# Patient Record
Sex: Female | Born: 1975 | Race: White | Hispanic: No | Marital: Married | Smoking: Never smoker
Health system: Southern US, Community
[De-identification: ages and names within clinical notes are randomized; demographics above are authoritative.]

## PROBLEM LIST (undated history)

## (undated) DIAGNOSIS — D6851 Activated protein C resistance: Secondary | ICD-10-CM

## (undated) DIAGNOSIS — I82409 Acute embolism and thrombosis of unspecified deep veins of unspecified lower extremity: Secondary | ICD-10-CM

## (undated) HISTORY — DX: Activated protein C resistance: D68.51

## (undated) HISTORY — DX: Acute embolism and thrombosis of unspecified deep veins of unspecified lower extremity: I82.409

---

## 2008-06-18 ENCOUNTER — Inpatient Hospital Stay (HOSPITAL_COMMUNITY): Admission: AD | Admit: 2008-06-18 | Discharge: 2008-06-18 | Payer: Self-pay | Admitting: Obstetrics & Gynecology

## 2008-11-15 DIAGNOSIS — I82409 Acute embolism and thrombosis of unspecified deep veins of unspecified lower extremity: Secondary | ICD-10-CM

## 2008-11-15 HISTORY — DX: Acute embolism and thrombosis of unspecified deep veins of unspecified lower extremity: I82.409

## 2008-11-28 ENCOUNTER — Encounter: Admission: RE | Admit: 2008-11-28 | Discharge: 2008-12-14 | Payer: Self-pay | Admitting: Certified Nurse Midwife

## 2008-12-15 ENCOUNTER — Inpatient Hospital Stay (HOSPITAL_COMMUNITY): Admission: AD | Admit: 2008-12-15 | Discharge: 2009-01-01 | Payer: Self-pay | Admitting: Obstetrics

## 2008-12-29 ENCOUNTER — Encounter (INDEPENDENT_AMBULATORY_CARE_PROVIDER_SITE_OTHER): Payer: Self-pay | Admitting: Obstetrics and Gynecology

## 2009-02-13 ENCOUNTER — Encounter (INDEPENDENT_AMBULATORY_CARE_PROVIDER_SITE_OTHER): Payer: Self-pay | Admitting: Obstetrics and Gynecology

## 2009-02-13 ENCOUNTER — Ambulatory Visit: Admission: RE | Admit: 2009-02-13 | Discharge: 2009-02-13 | Payer: Self-pay | Admitting: Obstetrics and Gynecology

## 2009-02-13 ENCOUNTER — Ambulatory Visit: Payer: Self-pay | Admitting: Vascular Surgery

## 2009-04-18 HISTORY — PX: DOPPER R LOWER EXTR (ARMC HX): HXRAD1696

## 2009-07-02 ENCOUNTER — Ambulatory Visit: Payer: Self-pay | Admitting: Oncology

## 2010-01-28 IMAGING — US US OB LIMITED
1 series · 14 of 18 positions shown · non-contrast
Comparison: none

OBSTETRICAL ULTRASOUND:
 This ultrasound exam was performed in the [HOSPITAL] Ultrasound Department.  The OB US report was generated in the AS system, and faxed to the ordering physician.  This report is also available in [REDACTED] PACS.

[Series 1: us fetal bpp w/o nonstress · non-contrast · 14 of 18 slices shown]
[im 1/18]
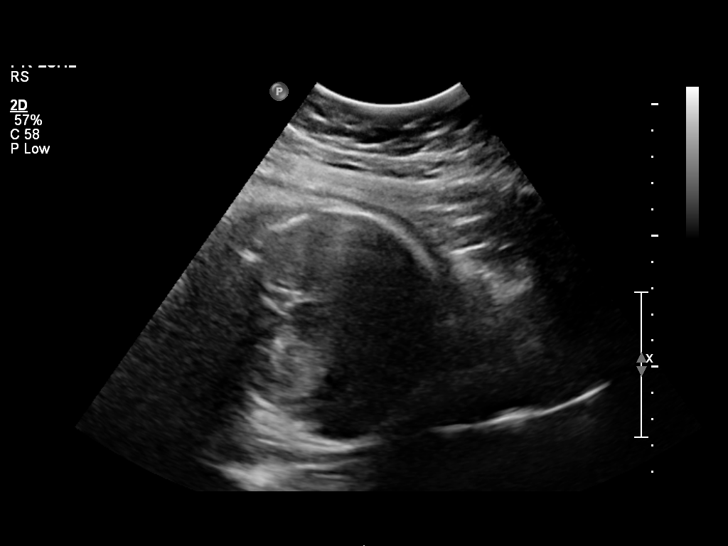
[im 2/18]
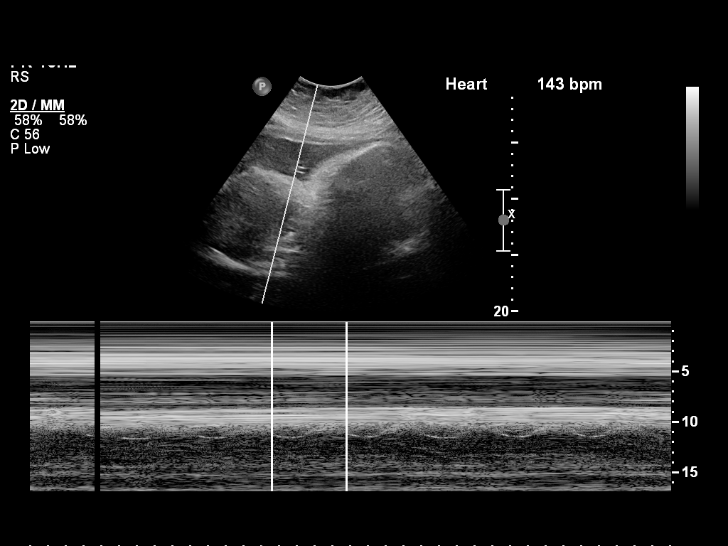
[im 4/18]
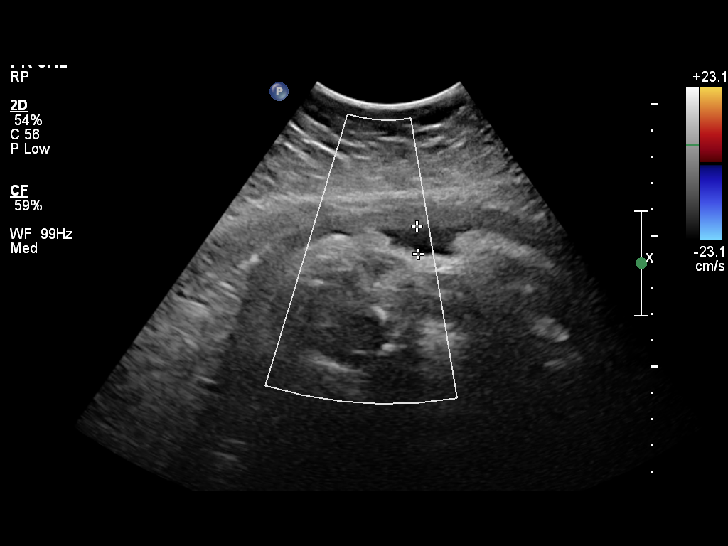
[im 5/18]
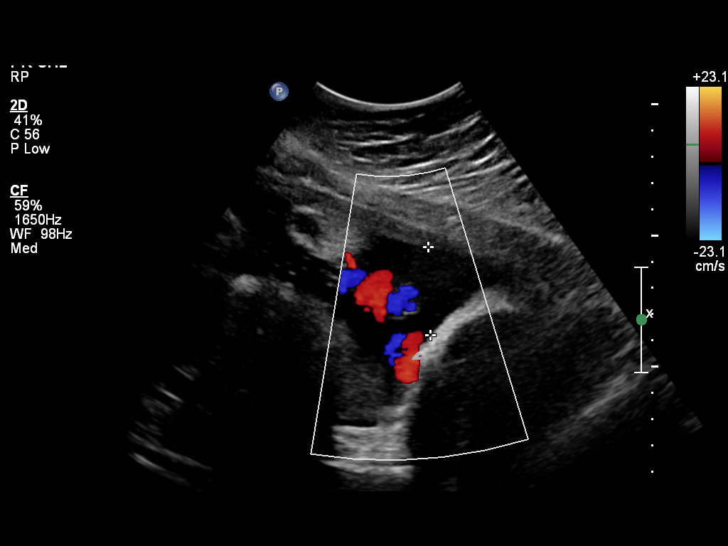
[im 6/18]
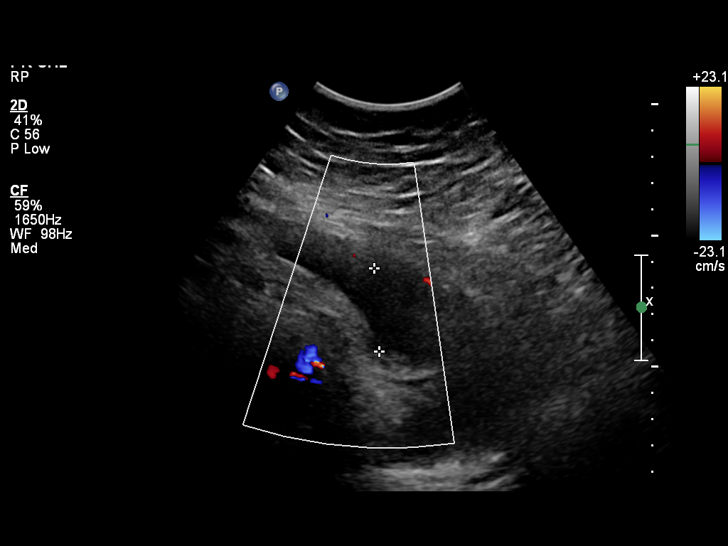
[im 8/18]
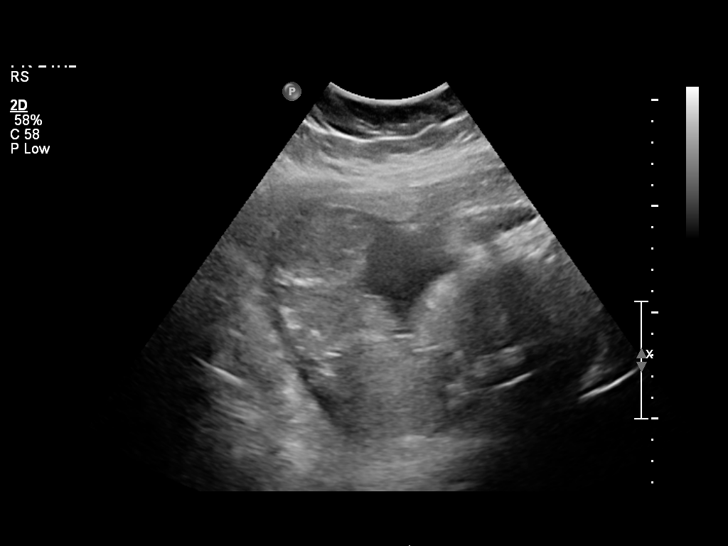
[im 9/18]
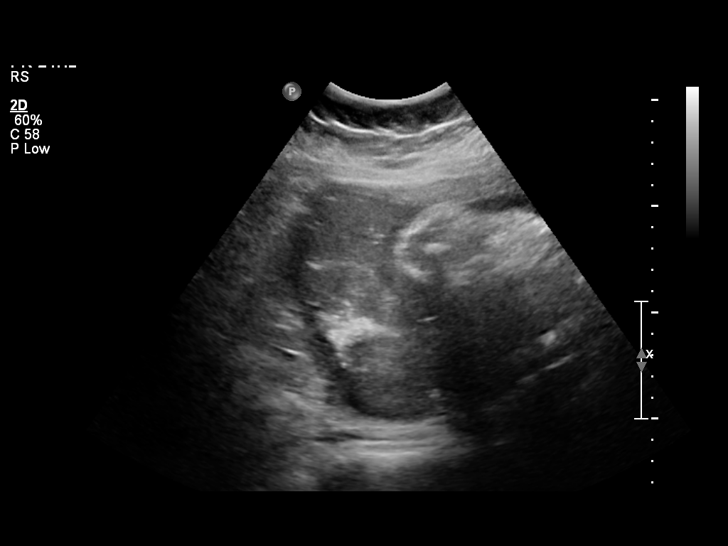
[im 10/18]
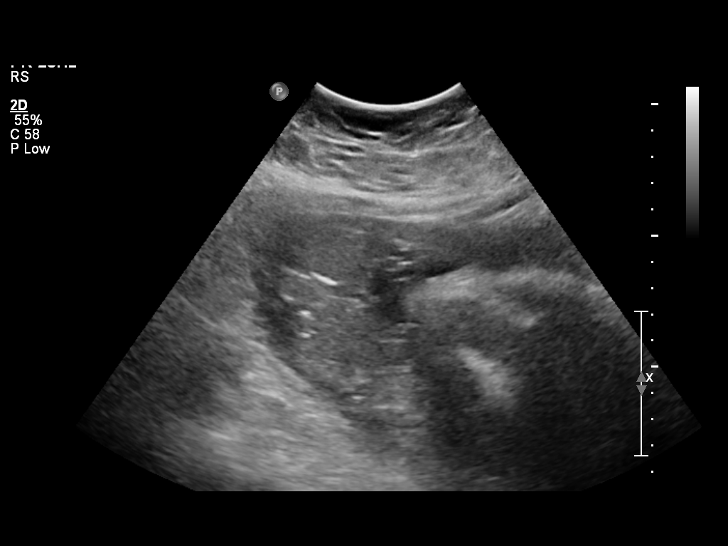
[im 11/18]
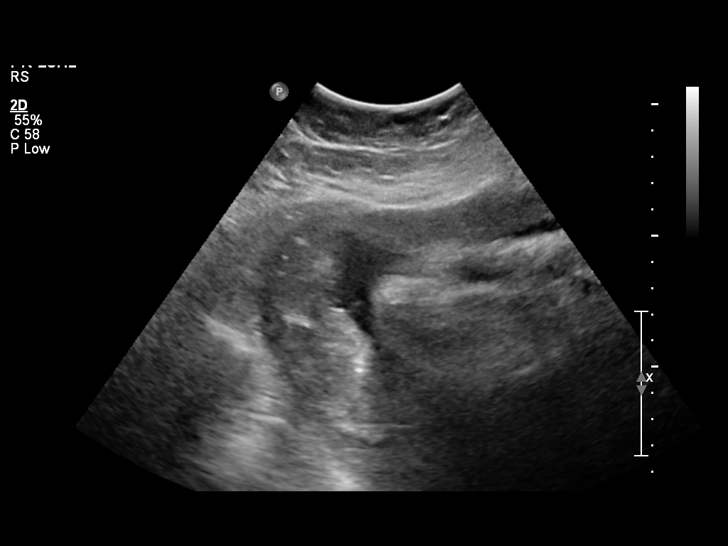
[im 13/18]
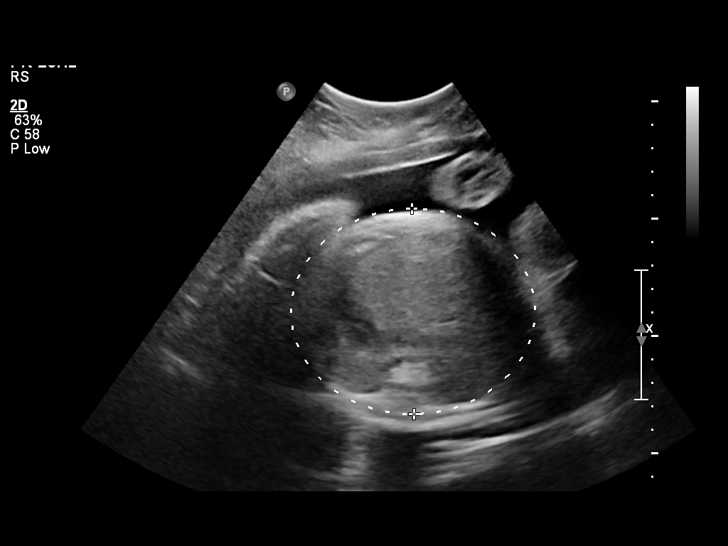
[im 14/18]
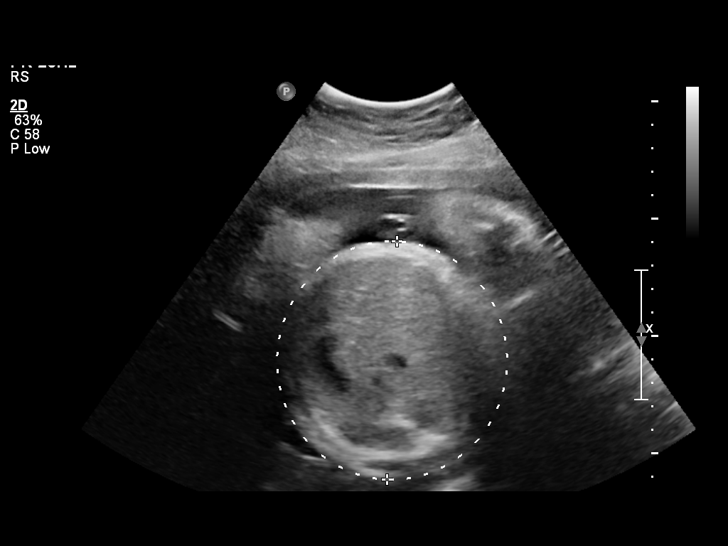
[im 15/18]
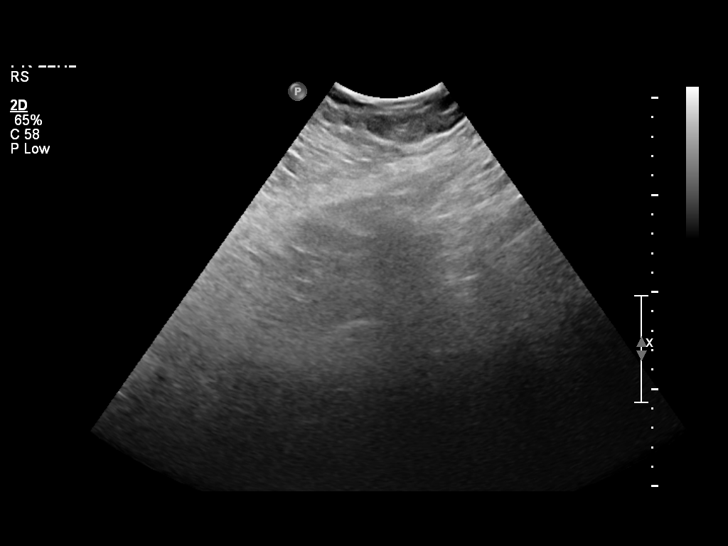
[im 17/18]
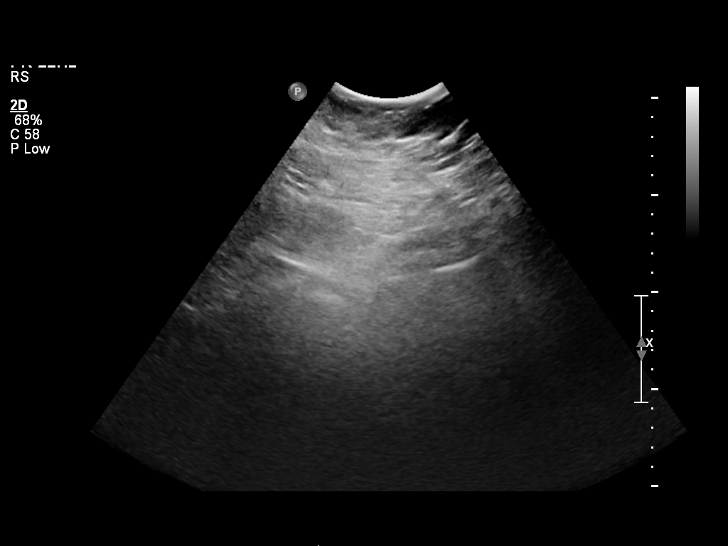
[im 18/18]
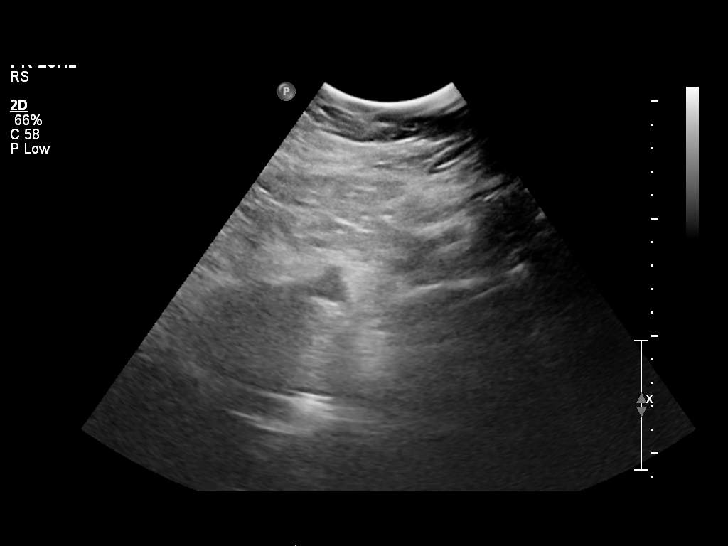

[14 of 18 positions shown; findings below may reference images not displayed]

IMPRESSION: See AS Obstetric US report.

## 2010-02-10 ENCOUNTER — Ambulatory Visit: Payer: Self-pay | Admitting: Oncology

## 2011-03-01 LAB — URINE MICROSCOPIC-ADD ON

## 2011-03-01 LAB — URINALYSIS, ROUTINE W REFLEX MICROSCOPIC
Bilirubin Urine: NEGATIVE
Glucose, UA: NEGATIVE mg/dL
Hgb urine dipstick: NEGATIVE
Ketones, ur: NEGATIVE mg/dL
Leukocytes, UA: NEGATIVE
Nitrite: NEGATIVE
Protein, ur: 100 mg/dL — AB
Specific Gravity, Urine: 1.015 (ref 1.005–1.030)
Urobilinogen, UA: 0.2 mg/dL (ref 0.0–1.0)
pH: 7 (ref 5.0–8.0)

## 2011-03-01 LAB — CBC
HCT: 35.6 % — ABNORMAL LOW (ref 36.0–46.0)
Hemoglobin: 11.7 g/dL — ABNORMAL LOW (ref 12.0–15.0)
MCHC: 33 g/dL (ref 30.0–36.0)
MCV: 85.5 fL (ref 78.0–100.0)
Platelets: 202 10*3/uL (ref 150–400)
RBC: 4.17 MIL/uL (ref 3.87–5.11)
RDW: 16.8 % — ABNORMAL HIGH (ref 11.5–15.5)
WBC: 8.9 10*3/uL (ref 4.0–10.5)

## 2011-03-01 LAB — COMPREHENSIVE METABOLIC PANEL
ALT: 33 U/L (ref 0–35)
AST: 40 U/L — ABNORMAL HIGH (ref 0–37)
Albumin: 2.5 g/dL — ABNORMAL LOW (ref 3.5–5.2)
Alkaline Phosphatase: 70 U/L (ref 39–117)
BUN: 5 mg/dL — ABNORMAL LOW (ref 6–23)
CO2: 24 mEq/L (ref 19–32)
Calcium: 8.4 mg/dL (ref 8.4–10.5)
Chloride: 108 mEq/L (ref 96–112)
Creatinine, Ser: 0.55 mg/dL (ref 0.4–1.2)
GFR calc Af Amer: >60 mL/min (ref >60)
GFR calc non Af Amer: >60 mL/min (ref >60)
Glucose, Bld: 102 mg/dL — ABNORMAL HIGH (ref 70–99)
Potassium: 4 mEq/L (ref 3.5–5.1)
Sodium: 136 mEq/L (ref 135–145)
Total Bilirubin: 0.2 mg/dL — ABNORMAL LOW (ref 0.3–1.2)
Total Protein: 5.4 g/dL — ABNORMAL LOW (ref 6.0–8.3)

## 2011-03-01 LAB — URIC ACID: Uric Acid, Serum: 4.3 mg/dL (ref 2.4–7.0)

## 2011-03-01 LAB — LACTATE DEHYDROGENASE: LDH: 222 U/L (ref 94–250)

## 2011-03-01 LAB — GLUCOSE, CAPILLARY: Glucose-Capillary: 97 mg/dL (ref 70–99)

## 2011-03-02 LAB — GLUCOSE, CAPILLARY
Glucose-Capillary: 102 mg/dL — ABNORMAL HIGH (ref 70–99)
Glucose-Capillary: 103 mg/dL — ABNORMAL HIGH (ref 70–99)
Glucose-Capillary: 104 mg/dL — ABNORMAL HIGH (ref 70–99)
Glucose-Capillary: 105 mg/dL — ABNORMAL HIGH (ref 70–99)
Glucose-Capillary: 105 mg/dL — ABNORMAL HIGH (ref 70–99)
Glucose-Capillary: 105 mg/dL — ABNORMAL HIGH (ref 70–99)
Glucose-Capillary: 107 mg/dL — ABNORMAL HIGH (ref 70–99)
Glucose-Capillary: 111 mg/dL — ABNORMAL HIGH (ref 70–99)
Glucose-Capillary: 111 mg/dL — ABNORMAL HIGH (ref 70–99)
Glucose-Capillary: 116 mg/dL — ABNORMAL HIGH (ref 70–99)
Glucose-Capillary: 136 mg/dL — ABNORMAL HIGH (ref 70–99)
Glucose-Capillary: 85 mg/dL (ref 70–99)
Glucose-Capillary: 85 mg/dL (ref 70–99)
Glucose-Capillary: 86 mg/dL (ref 70–99)
Glucose-Capillary: 87 mg/dL (ref 70–99)
Glucose-Capillary: 89 mg/dL (ref 70–99)
Glucose-Capillary: 92 mg/dL (ref 70–99)
Glucose-Capillary: 93 mg/dL (ref 70–99)
Glucose-Capillary: 95 mg/dL (ref 70–99)
Glucose-Capillary: 95 mg/dL (ref 70–99)
Glucose-Capillary: 96 mg/dL (ref 70–99)
Glucose-Capillary: 96 mg/dL (ref 70–99)
Glucose-Capillary: 98 mg/dL (ref 70–99)
Glucose-Capillary: 99 mg/dL (ref 70–99)

## 2011-03-02 LAB — CBC
HCT: 34 % — ABNORMAL LOW (ref 36.0–46.0)
HCT: 35.4 % — ABNORMAL LOW (ref 36.0–46.0)
HCT: 35.8 % — ABNORMAL LOW (ref 36.0–46.0)
HCT: 36 % (ref 36.0–46.0)
HCT: 36 % (ref 36.0–46.0)
HCT: 36.2 % (ref 36.0–46.0)
HCT: 37.7 % (ref 36.0–46.0)
Hemoglobin: 11.3 g/dL — ABNORMAL LOW (ref 12.0–15.0)
Hemoglobin: 11.7 g/dL — ABNORMAL LOW (ref 12.0–15.0)
Hemoglobin: 11.9 g/dL — ABNORMAL LOW (ref 12.0–15.0)
Hemoglobin: 12 g/dL (ref 12.0–15.0)
Hemoglobin: 13 g/dL (ref 12.0–15.0)
Hemoglobin: 13.1 g/dL (ref 12.0–15.0)
MCHC: 32.5 g/dL (ref 30.0–36.0)
MCHC: 33.1 g/dL (ref 30.0–36.0)
MCHC: 33.3 g/dL (ref 30.0–36.0)
MCHC: 33.5 g/dL (ref 30.0–36.0)
MCHC: 33.5 g/dL (ref 30.0–36.0)
MCHC: 33.6 g/dL (ref 30.0–36.0)
MCV: 85.2 fL (ref 78.0–100.0)
MCV: 85.3 fL (ref 78.0–100.0)
MCV: 85.5 fL (ref 78.0–100.0)
MCV: 85.8 fL (ref 78.0–100.0)
MCV: 85.8 fL (ref 78.0–100.0)
MCV: 85.9 fL (ref 78.0–100.0)
MCV: 86 fL (ref 78.0–100.0)
MCV: 86 fL (ref 78.0–100.0)
MCV: 86.7 fL (ref 78.0–100.0)
Platelets: 172 10*3/uL (ref 150–400)
Platelets: 179 10*3/uL (ref 150–400)
Platelets: 179 10*3/uL (ref 150–400)
Platelets: 183 10*3/uL (ref 150–400)
Platelets: 184 10*3/uL (ref 150–400)
Platelets: 188 10*3/uL (ref 150–400)
Platelets: 192 10*3/uL (ref 150–400)
Platelets: 266 10*3/uL (ref 150–400)
RBC: 3.9 MIL/uL (ref 3.87–5.11)
RBC: 4.16 MIL/uL (ref 3.87–5.11)
RBC: 4.19 MIL/uL (ref 3.87–5.11)
RBC: 4.22 MIL/uL (ref 3.87–5.11)
RBC: 4.5 MIL/uL (ref 3.87–5.11)
RBC: 4.59 MIL/uL (ref 3.87–5.11)
RDW: 16.5 % — ABNORMAL HIGH (ref 11.5–15.5)
RDW: 16.8 % — ABNORMAL HIGH (ref 11.5–15.5)
RDW: 17.3 % — ABNORMAL HIGH (ref 11.5–15.5)
RDW: 17.3 % — ABNORMAL HIGH (ref 11.5–15.5)
RDW: 17.9 % — ABNORMAL HIGH (ref 11.5–15.5)
RDW: 18.2 % — ABNORMAL HIGH (ref 11.5–15.5)
RDW: 18.3 % — ABNORMAL HIGH (ref 11.5–15.5)
WBC: 11.4 10*3/uL — ABNORMAL HIGH (ref 4.0–10.5)
WBC: 13.5 10*3/uL — ABNORMAL HIGH (ref 4.0–10.5)
WBC: 15.6 10*3/uL — ABNORMAL HIGH (ref 4.0–10.5)
WBC: 8.2 10*3/uL (ref 4.0–10.5)
WBC: 8.6 10*3/uL (ref 4.0–10.5)
WBC: 8.8 10*3/uL (ref 4.0–10.5)
WBC: 9 10*3/uL (ref 4.0–10.5)
WBC: 9.1 10*3/uL (ref 4.0–10.5)
WBC: 9.2 10*3/uL (ref 4.0–10.5)
WBC: 9.7 10*3/uL (ref 4.0–10.5)

## 2011-03-02 LAB — COMPREHENSIVE METABOLIC PANEL
ALT: 18 U/L (ref 0–35)
ALT: 21 U/L (ref 0–35)
ALT: 21 U/L (ref 0–35)
ALT: 22 U/L (ref 0–35)
ALT: 27 U/L (ref 0–35)
AST: 19 U/L (ref 0–37)
AST: 20 U/L (ref 0–37)
AST: 21 U/L (ref 0–37)
AST: 22 U/L (ref 0–37)
AST: 22 U/L (ref 0–37)
AST: 22 U/L (ref 0–37)
AST: 22 U/L (ref 0–37)
AST: 33 U/L (ref 0–37)
AST: 51 U/L — ABNORMAL HIGH (ref 0–37)
Albumin: 2 g/dL — ABNORMAL LOW (ref 3.5–5.2)
Albumin: 2.3 g/dL — ABNORMAL LOW (ref 3.5–5.2)
Albumin: 2.3 g/dL — ABNORMAL LOW (ref 3.5–5.2)
Albumin: 2.3 g/dL — ABNORMAL LOW (ref 3.5–5.2)
Albumin: 2.3 g/dL — ABNORMAL LOW (ref 3.5–5.2)
Albumin: 2.3 g/dL — ABNORMAL LOW (ref 3.5–5.2)
Albumin: 2.4 g/dL — ABNORMAL LOW (ref 3.5–5.2)
Albumin: 2.4 g/dL — ABNORMAL LOW (ref 3.5–5.2)
Albumin: 2.4 g/dL — ABNORMAL LOW (ref 3.5–5.2)
Alkaline Phosphatase: 71 U/L (ref 39–117)
Alkaline Phosphatase: 78 U/L (ref 39–117)
Alkaline Phosphatase: 86 U/L (ref 39–117)
BUN: 10 mg/dL (ref 6–23)
BUN: 6 mg/dL (ref 6–23)
BUN: 6 mg/dL (ref 6–23)
BUN: 6 mg/dL (ref 6–23)
BUN: 8 mg/dL (ref 6–23)
BUN: 9 mg/dL (ref 6–23)
CO2: 20 mEq/L (ref 19–32)
CO2: 20 mEq/L (ref 19–32)
CO2: 21 mEq/L (ref 19–32)
CO2: 21 mEq/L (ref 19–32)
CO2: 22 mEq/L (ref 19–32)
CO2: 25 mEq/L (ref 19–32)
Calcium: 8.2 mg/dL — ABNORMAL LOW (ref 8.4–10.5)
Calcium: 8.3 mg/dL — ABNORMAL LOW (ref 8.4–10.5)
Calcium: 8.4 mg/dL (ref 8.4–10.5)
Calcium: 8.6 mg/dL (ref 8.4–10.5)
Calcium: 8.6 mg/dL (ref 8.4–10.5)
Chloride: 105 mEq/L (ref 96–112)
Chloride: 105 mEq/L (ref 96–112)
Chloride: 106 mEq/L (ref 96–112)
Chloride: 107 mEq/L (ref 96–112)
Chloride: 107 mEq/L (ref 96–112)
Chloride: 107 mEq/L (ref 96–112)
Chloride: 108 mEq/L (ref 96–112)
Chloride: 108 mEq/L (ref 96–112)
Chloride: 109 mEq/L (ref 96–112)
Chloride: 109 mEq/L (ref 96–112)
Creatinine, Ser: 0.54 mg/dL (ref 0.4–1.2)
Creatinine, Ser: 0.57 mg/dL (ref 0.4–1.2)
Creatinine, Ser: 0.57 mg/dL (ref 0.4–1.2)
Creatinine, Ser: 0.58 mg/dL (ref 0.4–1.2)
Creatinine, Ser: 0.58 mg/dL (ref 0.4–1.2)
Creatinine, Ser: 0.59 mg/dL (ref 0.4–1.2)
Creatinine, Ser: 0.6 mg/dL (ref 0.4–1.2)
Creatinine, Ser: 0.61 mg/dL (ref 0.4–1.2)
Creatinine, Ser: 0.64 mg/dL (ref 0.4–1.2)
GFR calc Af Amer: >60 mL/min (ref >60)
GFR calc Af Amer: >60 mL/min (ref >60)
GFR calc Af Amer: >60 mL/min (ref >60)
GFR calc Af Amer: >60 mL/min (ref >60)
GFR calc Af Amer: >60 mL/min (ref >60)
GFR calc Af Amer: >60 mL/min (ref >60)
GFR calc Af Amer: >60 mL/min (ref >60)
GFR calc Af Amer: >60 mL/min (ref >60)
GFR calc non Af Amer: >60 mL/min (ref >60)
GFR calc non Af Amer: >60 mL/min (ref >60)
GFR calc non Af Amer: >60 mL/min (ref >60)
GFR calc non Af Amer: >60 mL/min (ref >60)
GFR calc non Af Amer: >60 mL/min (ref >60)
GFR calc non Af Amer: >60 mL/min (ref >60)
GFR calc non Af Amer: >60 mL/min (ref >60)
Glucose, Bld: 119 mg/dL — ABNORMAL HIGH (ref 70–99)
Glucose, Bld: 86 mg/dL (ref 70–99)
Glucose, Bld: 90 mg/dL (ref 70–99)
Glucose, Bld: 92 mg/dL (ref 70–99)
Glucose, Bld: 95 mg/dL (ref 70–99)
Glucose, Bld: 96 mg/dL (ref 70–99)
Potassium: 3.8 mEq/L (ref 3.5–5.1)
Potassium: 3.8 mEq/L (ref 3.5–5.1)
Potassium: 3.9 mEq/L (ref 3.5–5.1)
Potassium: 4 mEq/L (ref 3.5–5.1)
Potassium: 4.2 mEq/L (ref 3.5–5.1)
Sodium: 133 mEq/L — ABNORMAL LOW (ref 135–145)
Sodium: 135 mEq/L (ref 135–145)
Sodium: 136 mEq/L (ref 135–145)
Sodium: 136 mEq/L (ref 135–145)
Sodium: 136 mEq/L (ref 135–145)
Total Bilirubin: 0.3 mg/dL (ref 0.3–1.2)
Total Bilirubin: 0.3 mg/dL (ref 0.3–1.2)
Total Bilirubin: 0.3 mg/dL (ref 0.3–1.2)
Total Bilirubin: 0.4 mg/dL (ref 0.3–1.2)
Total Bilirubin: 0.4 mg/dL (ref 0.3–1.2)
Total Bilirubin: 0.5 mg/dL (ref 0.3–1.2)
Total Bilirubin: 0.6 mg/dL (ref 0.3–1.2)
Total Bilirubin: 0.7 mg/dL (ref 0.3–1.2)
Total Protein: 4.7 g/dL — ABNORMAL LOW (ref 6.0–8.3)
Total Protein: 4.9 g/dL — ABNORMAL LOW (ref 6.0–8.3)
Total Protein: 5 g/dL — ABNORMAL LOW (ref 6.0–8.3)
Total Protein: 5.1 g/dL — ABNORMAL LOW (ref 6.0–8.3)
Total Protein: 5.4 g/dL — ABNORMAL LOW (ref 6.0–8.3)
Total Protein: 5.5 g/dL — ABNORMAL LOW (ref 6.0–8.3)

## 2011-03-02 LAB — CREATININE CLEARANCE, URINE, 24 HOUR
Collection Interval-CRCL: 24 hours
Creatinine Clearance: 205 mL/min — ABNORMAL HIGH (ref 75–115)
Creatinine, 24H Ur: 2189 mg/d — ABNORMAL HIGH (ref 700–1800)
Creatinine, Urine: 76.2 mg/dL
Creatinine, Urine: 96.5 mg/dL
Creatinine: 0.58 mg/dL (ref 0.40–1.20)
Creatinine: 0.62 mg/dL (ref 0.40–1.20)
Urine Total Volume-CRCL: 2875 mL

## 2011-03-02 LAB — LACTATE DEHYDROGENASE
LDH: 158 U/L (ref 94–250)
LDH: 159 U/L (ref 94–250)
LDH: 169 U/L (ref 94–250)
LDH: 178 U/L (ref 94–250)
LDH: 201 U/L (ref 94–250)

## 2011-03-02 LAB — DIFFERENTIAL
Basophils Absolute: 0 10*3/uL (ref 0.0–0.1)
Basophils Relative: 1 % (ref 0–1)
Lymphocytes Relative: 13 % (ref 12–46)
Neutro Abs: 6.5 10*3/uL (ref 1.7–7.7)
Neutrophils Relative %: 79 % — ABNORMAL HIGH (ref 43–77)

## 2011-03-02 LAB — URIC ACID
Uric Acid, Serum: 4.6 mg/dL (ref 2.4–7.0)
Uric Acid, Serum: 4.7 mg/dL (ref 2.4–7.0)
Uric Acid, Serum: 4.7 mg/dL (ref 2.4–7.0)
Uric Acid, Serum: 4.8 mg/dL (ref 2.4–7.0)
Uric Acid, Serum: 4.9 mg/dL (ref 2.4–7.0)
Uric Acid, Serum: 5.1 mg/dL (ref 2.4–7.0)

## 2011-03-02 LAB — STREP B DNA PROBE: Strep Group B Ag: POSITIVE

## 2011-03-02 LAB — DIC (DISSEMINATED INTRAVASCULAR COAGULATION)PANEL
Fibrinogen: 405 mg/dL (ref 204–475)
Platelets: 251 10*3/uL (ref 150–400)
Prothrombin Time: 12.8 seconds (ref 11.6–15.2)

## 2011-03-02 LAB — PROTEIN, URINE, 24 HOUR: Urine Total Volume-UPROT: 1900 mL

## 2011-03-30 NOTE — Op Note (Signed)
NAMESANTOSHA, Gabriella Carey                  ACCOUNT NO.:  [id number]   MEDICAL RECORD NO.:  [id number]          PATIENT TYPE:  INP   LOCATION:  9162                          [facility]ILITY:  [facility]   PHYSICIAN:  Maxie Better, M.D.DATE OF BIRTH:  26-Jun-1976   DATE OF PROCEDURE:  12/29/2008  DATE OF DISCHARGE:                               OPERATIVE REPORT   PREOPERATIVE DIAGNOSES:  1. Nonreassuring fetal tracing.  2. Intrauterine gestation at 37 weeks.  3. Chronic hypertension with superimposed preeclampsia.  4. Impaired glucose tolerance.   PROCEDURE:  Urgent primary cesarean section, Kerr hysterotomy.   POSTOPERATIVE DIAGNOSES:  1. Nonreassuring fetal tracing.  2. Intrauterine gestation at 37 weeks.  3. Chronic hypertension with superimposed preeclampsia.  4. Impaired glucose tolerance.   ANESTHESIA:  Epidural.   SURGEON:  Maxie Better, MD   ASSISTANT:  [name], CNM   PROCEDURE:  Under adequate epidural anesthesia, the patient was placed  in supine position and indwelling Foley catheter was already in place.  The patient was quickly prepped and draped.  Marcaine 0.25% was injected  along the planned Pfannenstiel skin incision site.  A Pfannenstiel skin  incision was then made, carried down to the rectus fascia.  The rectus  fascia opened transversely.  The rectus fascia was then bluntly and  sharply dissected off the rectus muscle in superior and inferior  fashion.  The rectus muscle was split in midline.  The parietal  peritoneum entered bluntly.  The vesicouterine peritoneum was opened  transversely.  The bladder was then bluntly dissected off the lower  uterine segment, displaced inferiorly with a bladder retractor.  A  curvilinear low transverse uterine incision was then made and extended  with bandage scissors.  Subsequent delivery of a live female was  accomplished.  Baby was bulb suctioned.  The abdominal cord was clamped  and cut.  The baby was transferred to the  awaiting pediatrician who  assigned Apgars of 8 and 9 at 1 and 5 minutes.  Placenta was manually  removed after cord pH was obtained which was ultimately 7.30.  Uterine  cavity was cleaned of debris.  Uterine incision had no extension.  It  was closed in 2 layers, the first layer with 0 Monocryl in running lock  stitch, the second layer was imbricated using 0 Monocryl suture.  Normal  tubes and ovaries were noted bilaterally.  The abdomen was then  copiously irrigated and suctioned of debris with good hemostasis noted.  The parietal peritoneum was actually opened beneath the rectus muscle  and therefore was not closed.  There was a large amount of preperitoneal  fat.  The rectus fascia was closed with 0 Vicryl x2.  The subcutaneous  area which was of at least 2 inches deep was closed with 2-0 plain  sutures.  The skin approximated using Ethicon staples.   SPECIMENS:  Placenta sent to Pathology.   ESTIMATED BLOOD LOSS:  800 mL.   URINE OUTPUT:  500 mL, clear yellow urine.   INTRAOPERATIVE FLUID:  1 liter.   Sponge and instrument counts x2 was correct.  Complications none.  Weight of the baby was 5 pounds 3 ounces.  The patient tolerated  procedure well and was transferred to recovery room in stable condition.      Maxie Better, M.D.  Electronically Signed     [place]/MEDQ  D:  12/29/2008  T:  12/30/2008  Job:  [id number]

## 2011-04-02 NOTE — Discharge Summary (Signed)
Gabriella Carey, Gabriella Carey                  ACCOUNT NO.:  [id number]   MEDICAL RECORD NO.:  [id number]          PATIENT TYPE:  INP   LOCATION:  9132                          [facility]ILITY[facility]  [facility]   PHYSICIAN:  Maxie Better, M.D.DATE OF BIRTH:  06/19/1976   DATE OF ADMISSION:  12/15/2008  DATE OF DISCHARGE:  01/01/2009                               DISCHARGE SUMMARY   ADMISSION DIAGNOSES:  1. Chronic hypertension with superimposed preeclampsia.  2. Glucose intolerance.   DISCHARGE DIAGNOSES:  1. Chronic hypertension superimposed preeclampsia.  2. Nonreassuring fetal tracing.  3. Intrauterine gestation at 37 weeks delivered.  4. Impaired glucose.   PROCEDURE:  Primary cesarean section Kerr hysterotomy.   HISTORY OF PRESENT ILLNESS:  A 35 year old gravida 1, para 0 female at  37-2/7 weeks admitted by [name] for management of her chronic  hypertension with increasing blood pressures, for evaluation of possible  superimposed preeclampsia.   HOSPITAL COURSE:  The patient was admitted to [facility].  She was  continued on labetalol 100 mg p.o. b.i.d.  A 24-hour urine collection  was started.  Her blood pressure was 166/98.  The patient had PIH labs  performed.  Growth scan done on December 17, 2008, that showed an  estimated fetal weight of 2373, normal amniotic fluid, abdominal  circumference 31 percentile, BPP is 8/8.  Her AST was 45.  Her ALT was  44.  Her hemoglobin was 11.9 and her platelet count was 184,000.  Uric  acid was 4.8.  The patient subsequently had group B strep culture  performed.  Her labetalol was subsequently increased and Procardia 10 mg  q.8 h. Added.  She was followed with PIH labs.  Her 24-hour urine showed  1300 mg of protein.  The patient remained in-house for management.  Her  group B strep subsequently was positive.  Her blood sugars were  monitored.  The patient had induction of labor on December 27, 2008.  Penicillin was started.  Procardia was  continued.  PIH labs were redone.  Cytotec was used.  The patient had Foley balloon placement subsequently  on December 27, 2008.  DIC panel remained normal.  Pitocin was  continued.  Cytotec was redone on December 29, 2008.  On December 29, 2008, the patient had a fetal heart rate deceleration down to 80s-90s  for 2 minutes.  Pitocin was off.  She was 8 cm, 90% +1 station with a  caput positive scalp stem.  Internal scalp electrodes applied.  Fetal  heart remained down, and the patient was taken to the operating room.  Primary cesarean section was performed.  Live female, normal tubes and  ovaries.  Apgars of 8 and 9, cord pH of 7.30, 5 pounds 3 ounces baby.  Postop care, her CBC on postop day #1 showed a hemoglobin of 11.3,  hematocrit 34, white count of 15.6, platelet count of 213,000.  PIH labs  showed normalization of her liver studies.  Her blood pressure was  116/72.  Her labetalol doses was decreased and Procardia discontinued.  The patient subsequently was discharged on postop day #  3.  The blood  pressure range was 113-139 /76-84.  Incision had no evidence of  infection.  She was deemed well to be discharged home.  Disposition is  home.  Condition is stable.   DISCHARGE MEDICATIONS:  Labetalol 200 mg t.i.d.   DISCHARGE INSTRUCTIONS:  Per the postpartum booklet given.  Additional  medications at discharge were,  1. Percocet 1-2 tablets every 4-6 hours p.r.n. pain.  2. Motrin 600-800 mg every day 8 hours p.r.n. pain.  3. Prenatal vitamins 1 p.o. daily.   Followup appointment at [facility] for blood pressure checks in the  upcoming week and 6 weeks postpartum.      Maxie Better, M.D.  Electronically Signed     [place]/MEDQ  D:  01/26/2009  T:  01/27/2009  Job:  [id number]

## 2011-08-13 LAB — URINALYSIS, ROUTINE W REFLEX MICROSCOPIC
Bilirubin Urine: NEGATIVE
Glucose, UA: 100 — AB
Glucose, UA: NEGATIVE
Hgb urine dipstick: NEGATIVE
Hgb urine dipstick: NEGATIVE
Ketones, ur: 15 — AB
Ketones, ur: >80 — AB
Leukocytes, UA: NEGATIVE
Nitrite: NEGATIVE
Nitrite: NEGATIVE
Protein, ur: 30 — AB
Protein, ur: NEGATIVE
Specific Gravity, Urine: 1.025
Specific Gravity, Urine: <1.005 — ABNORMAL LOW
Urobilinogen, UA: 0.2
Urobilinogen, UA: 1
pH: 6.5
pH: 6.5

## 2011-08-13 LAB — URINE MICROSCOPIC-ADD ON

## 2013-01-21 ENCOUNTER — Ambulatory Visit: Payer: Self-pay | Admitting: Cardiovascular Disease

## 2013-01-21 DIAGNOSIS — I82409 Acute embolism and thrombosis of unspecified deep veins of unspecified lower extremity: Secondary | ICD-10-CM | POA: Insufficient documentation

## 2013-01-21 DIAGNOSIS — Z7901 Long term (current) use of anticoagulants: Secondary | ICD-10-CM | POA: Insufficient documentation

## 2013-07-27 ENCOUNTER — Ambulatory Visit: Payer: Self-pay | Admitting: Pharmacist Clinician (PhC)/ Clinical Pharmacy Specialist

## 2013-08-10 ENCOUNTER — Ambulatory Visit: Payer: Self-pay | Admitting: Pharmacist Clinician (PhC)/ Clinical Pharmacy Specialist

## 2013-08-13 ENCOUNTER — Other Ambulatory Visit: Payer: Self-pay | Admitting: Cardiovascular Disease

## 2013-08-15 NOTE — Telephone Encounter (Signed)
Need refill on Warfarin 7.5 mg #130 please.

## 2013-08-17 ENCOUNTER — Ambulatory Visit (INDEPENDENT_AMBULATORY_CARE_PROVIDER_SITE_OTHER): Payer: 59 | Admitting: Pharmacist Clinician (PhC)/ Clinical Pharmacy Specialist

## 2013-08-17 DIAGNOSIS — I82409 Acute embolism and thrombosis of unspecified deep veins of unspecified lower extremity: Secondary | ICD-10-CM

## 2013-08-17 DIAGNOSIS — Z7901 Long term (current) use of anticoagulants: Secondary | ICD-10-CM

## 2013-08-17 MED ORDER — RIVAROXABAN 20 MG PO TABS: 20.0000 mg | ORAL_TABLET | Freq: Every day | ORAL | Status: DC

## 2013-09-24 ENCOUNTER — Telehealth: Payer: Self-pay | Admitting: Cardiovascular Disease

## 2013-09-24 NOTE — Telephone Encounter (Signed)
Has questions about medication Xarelto  Please call

## 2013-09-26 NOTE — Telephone Encounter (Signed)
Pt LMOM for Phillips Hay PharmD stating she would be available to talk at lunchtime today 12:30-1:30.  Did not receive message from VM until after 3.  Will try after 5pm today

## 2013-10-02 NOTE — Telephone Encounter (Signed)
LMOM for patient regarding her concerns.  Explained that ER protocols are in place for people on Xarelto to deal with bleeding and the need to stop in case of trauma.  In regards to her menstrual cycle, although I don't have other pre-menopausal patients on Xarelto, I explained that even with warfarin, the first several months are heavy cycles, then they tend to go back to what is normal for that patient.  Asked that she ride it out for a couple of months before deciding that this won't work, as she was not good with INR checks.

## 2013-10-02 NOTE — Telephone Encounter (Signed)
Pt LM on my VM, her concerns with the Xarelto are 1) lack of reversibility and 2) her menstrual cycle was "extreme" this past month and she isn't sure she can do that again.

## 2013-10-04 ENCOUNTER — Telehealth: Payer: Self-pay | Admitting: Pharmacist Clinician (PhC)/ Clinical Pharmacy Specialist

## 2013-10-04 NOTE — Telephone Encounter (Signed)
Pt had concern about Xarelto commercial (CALL 1-800-BAD-DRUG!).   Called pt back, explained that the commercials use factual information out of context, and that she is in no more danger than she was with warfarin.  She needs lifelong anticoagulation and over time, this will be a much safer alternative for her, as she was non-compliant about getting INR checks, due to active life/having small child.  She was re-assured, and I told her to call any time with questions or concerns

## 2013-11-26 ENCOUNTER — Telehealth: Payer: Self-pay | Admitting: Pharmacist Clinician (PhC)/ Clinical Pharmacy Specialist

## 2013-11-26 MED ORDER — WARFARIN SODIUM 5 MG PO TABS: 5.0000 mg | ORAL_TABLET | Freq: Every day | ORAL | Status: DC

## 2013-11-26 NOTE — Telephone Encounter (Signed)
Pt called Friday, LMOM, has been on Xarelto since 10/3.  States her menstrual cycle has gotten worse with each month.  Increased number of days and much heavier bleeding.  Would like to go back to warfarin and willing to get INR checks.

## 2013-11-26 NOTE — Telephone Encounter (Signed)
Pulled paper chart, her last dose of warfarin was 5mg  qd x 7.5mg  once weekly.   LMOM for patient that we can go back to warfarin, will want to overlap the meds by 2-3 days.  [facility] and call patient with information [place].

## 2013-12-21 ENCOUNTER — Ambulatory Visit (INDEPENDENT_AMBULATORY_CARE_PROVIDER_SITE_OTHER): Payer: 59 | Admitting: Pharmacist Clinician (PhC)/ Clinical Pharmacy Specialist

## 2013-12-21 VITALS — BP 112/70 | HR 68

## 2013-12-21 DIAGNOSIS — I82409 Acute embolism and thrombosis of unspecified deep veins of unspecified lower extremity: Secondary | ICD-10-CM

## 2013-12-21 DIAGNOSIS — Z7901 Long term (current) use of anticoagulants: Secondary | ICD-10-CM

## 2013-12-21 LAB — POCT INR: INR: 1.2

## 2013-12-28 ENCOUNTER — Ambulatory Visit (INDEPENDENT_AMBULATORY_CARE_PROVIDER_SITE_OTHER): Payer: 59 | Admitting: Pharmacist Clinician (PhC)/ Clinical Pharmacy Specialist

## 2013-12-28 VITALS — BP 120/80 | HR 64

## 2013-12-28 DIAGNOSIS — I82409 Acute embolism and thrombosis of unspecified deep veins of unspecified lower extremity: Secondary | ICD-10-CM

## 2013-12-28 DIAGNOSIS — Z7901 Long term (current) use of anticoagulants: Secondary | ICD-10-CM

## 2013-12-28 LAB — POCT INR: INR: 1.8

## 2014-01-15 ENCOUNTER — Telehealth: Payer: Self-pay | Admitting: *Deleted

## 2014-01-15 DIAGNOSIS — I82409 Acute embolism and thrombosis of unspecified deep veins of unspecified lower extremity: Secondary | ICD-10-CM

## 2014-01-15 NOTE — Telephone Encounter (Signed)
Order placed for doppler.  Lm for patient with instructions.  [facility]

## 2014-01-15 NOTE — Telephone Encounter (Signed)
Message copied by Marella BileVOGEL, Tillie Viverette W. on Tue Jan 15, 2014  3:54 PM ------      Message from: Runell GessBERRY, JONATHAN J      Created: Tue Jan 15, 2014  7:56 AM       Yes, but to get another venous Doppler study to see what is going on. His current INR subtherapeutic she may have a coagulopathy protein S or C. Deficiency or lupus anticoagulant. We may have to go up on her Coumadin dose to get her into a therapeutic range.            Runell GessJonathan J. Berry, M.D., FACP, [facility] CenterFACC, Earl LagosFAHA, [facility]SCAI      [facility]      [address]      [place], [place]  [id number]       [phone]      01/15/2014      7:56 AM            ----- Message -----         From: Marella BileKathryn W Haydyn Girvan, RN         Sent: 01/15/2014   7:53 AM           To: Runell GessJonathan J Berry, MD            Please review      ----- Message -----         From: Phillips HayKristin Alvstad, RPH-CPP         Sent: 01/14/2014   4:05 PM           To: Marella BileKathryn W Pierson Vantol, RN            For Dr. Allyson SabalBerry to review:            Pt has been having intermittent pain in R leg where original DVT was.  She was switched to Xarelto about 4-5 months ago because of INR non-compliandce, but switched back to warfarin after 4 months because of prolonged heavy menstrual cycles.  She has been compliant with INR checks on warfarin since, but her INR has yet to be therapeutic.  I will see her again on Friday (last check cancelled due to weather).  Pt wants to know if she should have another doppler done      HintonKristin             ------

## 2014-01-18 ENCOUNTER — Ambulatory Visit (HOSPITAL_COMMUNITY)
Admission: RE | Admit: 2014-01-18 | Discharge: 2014-01-18 | Disposition: A | Discharge status: Home / Self Care | Payer: 59 | Source: Ambulatory Visit | Attending: Cardiovascular Disease | Admitting: Cardiovascular Disease

## 2014-01-18 ENCOUNTER — Ambulatory Visit (INDEPENDENT_AMBULATORY_CARE_PROVIDER_SITE_OTHER): Payer: 59 | Admitting: Pharmacist Clinician (PhC)/ Clinical Pharmacy Specialist

## 2014-01-18 VITALS — BP 116/78 | HR 60

## 2014-01-18 DIAGNOSIS — Z7901 Long term (current) use of anticoagulants: Secondary | ICD-10-CM

## 2014-01-18 DIAGNOSIS — I82409 Acute embolism and thrombosis of unspecified deep veins of unspecified lower extremity: Secondary | ICD-10-CM

## 2014-01-18 LAB — POCT INR: INR: 1.4

## 2014-01-18 NOTE — Progress Notes (Signed)
Right Lower Extremity Venous Duplex Completed. °Brianna L Mazza,RVT °

## 2014-01-25 ENCOUNTER — Ambulatory Visit (INDEPENDENT_AMBULATORY_CARE_PROVIDER_SITE_OTHER): Payer: 59 | Admitting: Pharmacist Clinician (PhC)/ Clinical Pharmacy Specialist

## 2014-01-25 DIAGNOSIS — Z7901 Long term (current) use of anticoagulants: Secondary | ICD-10-CM

## 2014-01-25 LAB — POCT INR: INR: 1.7

## 2014-01-25 MED ORDER — WARFARIN SODIUM 5 MG PO TABS: ORAL_TABLET | ORAL | Status: DC

## 2014-02-05 ENCOUNTER — Telehealth: Payer: Self-pay | Admitting: Cardiovascular Disease

## 2014-02-05 NOTE — Telephone Encounter (Signed)
Patient notified of results.

## 2014-02-05 NOTE — Telephone Encounter (Signed)
Returning your call, °

## 2014-02-08 ENCOUNTER — Ambulatory Visit (INDEPENDENT_AMBULATORY_CARE_PROVIDER_SITE_OTHER): Payer: 59 | Admitting: Pharmacist Clinician (PhC)/ Clinical Pharmacy Specialist

## 2014-02-08 DIAGNOSIS — Z7901 Long term (current) use of anticoagulants: Secondary | ICD-10-CM

## 2014-02-08 LAB — POCT INR: INR: 2

## 2014-02-22 ENCOUNTER — Ambulatory Visit: Payer: 59 | Admitting: Pharmacist Clinician (PhC)/ Clinical Pharmacy Specialist

## 2014-04-02 ENCOUNTER — Other Ambulatory Visit: Payer: Self-pay | Admitting: Pharmacist Clinician (PhC)/ Clinical Pharmacy Specialist

## 2014-04-02 MED ORDER — WARFARIN SODIUM 5 MG PO TABS
ORAL_TABLET | ORAL | Status: DC
Start: 2014-04-02 — End: 2014-05-02

## 2014-05-02 ENCOUNTER — Other Ambulatory Visit: Payer: Self-pay | Admitting: Pharmacist Clinician (PhC)/ Clinical Pharmacy Specialist

## 2014-05-02 MED ORDER — WARFARIN SODIUM 5 MG PO TABS: ORAL_TABLET | ORAL | Status: DC

## 2014-06-14 ENCOUNTER — Ambulatory Visit (INDEPENDENT_AMBULATORY_CARE_PROVIDER_SITE_OTHER): Payer: 59 | Admitting: Pharmacist Clinician (PhC)/ Clinical Pharmacy Specialist

## 2014-06-14 DIAGNOSIS — Z7901 Long term (current) use of anticoagulants: Secondary | ICD-10-CM

## 2014-06-14 DIAGNOSIS — I82409 Acute embolism and thrombosis of unspecified deep veins of unspecified lower extremity: Secondary | ICD-10-CM

## 2014-06-14 LAB — POCT INR: INR: 2.6

## 2014-06-14 MED ORDER — WARFARIN SODIUM 5 MG PO TABS: ORAL_TABLET | ORAL | Status: DC

## 2014-07-12 ENCOUNTER — Ambulatory Visit (INDEPENDENT_AMBULATORY_CARE_PROVIDER_SITE_OTHER): Payer: 59 | Admitting: *Deleted

## 2014-07-12 DIAGNOSIS — Z7901 Long term (current) use of anticoagulants: Secondary | ICD-10-CM

## 2014-07-12 DIAGNOSIS — I82409 Acute embolism and thrombosis of unspecified deep veins of unspecified lower extremity: Secondary | ICD-10-CM

## 2014-07-12 LAB — POCT INR: INR: 3.7

## 2014-08-13 ENCOUNTER — Other Ambulatory Visit: Payer: Self-pay

## 2014-08-19 ENCOUNTER — Other Ambulatory Visit: Payer: Self-pay | Admitting: Pharmacist Clinician (PhC)/ Clinical Pharmacy Specialist

## 2014-08-19 MED ORDER — WARFARIN SODIUM 5 MG PO TABS: ORAL_TABLET | ORAL | Status: DC

## 2014-08-23 ENCOUNTER — Ambulatory Visit: Payer: 59 | Admitting: Pharmacist Clinician (PhC)/ Clinical Pharmacy Specialist

## 2014-09-06 ENCOUNTER — Ambulatory Visit: Payer: 59 | Admitting: Pharmacist Clinician (PhC)/ Clinical Pharmacy Specialist

## 2014-09-14 ENCOUNTER — Other Ambulatory Visit: Payer: Self-pay | Admitting: Cardiovascular Disease

## 2014-09-19 ENCOUNTER — Ambulatory Visit (INDEPENDENT_AMBULATORY_CARE_PROVIDER_SITE_OTHER): Payer: 59 | Admitting: *Deleted

## 2014-09-19 DIAGNOSIS — I82409 Acute embolism and thrombosis of unspecified deep veins of unspecified lower extremity: Secondary | ICD-10-CM

## 2014-09-19 DIAGNOSIS — Z7901 Long term (current) use of anticoagulants: Secondary | ICD-10-CM

## 2014-09-19 LAB — POCT INR: INR: 3.1

## 2014-09-19 MED ORDER — WARFARIN SODIUM 5 MG PO TABS: ORAL_TABLET | ORAL | Status: DC

## 2015-05-30 ENCOUNTER — Encounter: Payer: Self-pay | Admitting: *Deleted

## 2015-06-06 ENCOUNTER — Ambulatory Visit (INDEPENDENT_AMBULATORY_CARE_PROVIDER_SITE_OTHER): Payer: 59 | Admitting: Pharmacist Clinician (PhC)/ Clinical Pharmacy Specialist

## 2015-06-06 DIAGNOSIS — Z7901 Long term (current) use of anticoagulants: Secondary | ICD-10-CM | POA: Diagnosis not present

## 2015-06-06 DIAGNOSIS — I82409 Acute embolism and thrombosis of unspecified deep veins of unspecified lower extremity: Secondary | ICD-10-CM

## 2015-06-06 LAB — POCT INR: INR: 3.1

## 2015-06-16 ENCOUNTER — Encounter: Payer: Self-pay | Admitting: Cardiovascular Disease

## 2015-06-25 ENCOUNTER — Other Ambulatory Visit: Payer: Self-pay | Admitting: Cardiovascular Disease

## 2015-07-04 ENCOUNTER — Ambulatory Visit (INDEPENDENT_AMBULATORY_CARE_PROVIDER_SITE_OTHER): Payer: 59 | Admitting: Pharmacist Clinician (PhC)/ Clinical Pharmacy Specialist

## 2015-07-04 DIAGNOSIS — Z7901 Long term (current) use of anticoagulants: Secondary | ICD-10-CM

## 2015-07-04 DIAGNOSIS — I82409 Acute embolism and thrombosis of unspecified deep veins of unspecified lower extremity: Secondary | ICD-10-CM | POA: Diagnosis not present

## 2015-07-04 LAB — POCT INR: INR: 2.3

## 2015-08-01 ENCOUNTER — Ambulatory Visit (INDEPENDENT_AMBULATORY_CARE_PROVIDER_SITE_OTHER): Payer: 59 | Admitting: Pharmacist Clinician (PhC)/ Clinical Pharmacy Specialist

## 2015-08-01 DIAGNOSIS — Z7901 Long term (current) use of anticoagulants: Secondary | ICD-10-CM

## 2015-08-01 DIAGNOSIS — I82409 Acute embolism and thrombosis of unspecified deep veins of unspecified lower extremity: Secondary | ICD-10-CM

## 2015-08-01 LAB — POCT INR: INR: 3

## 2015-09-04 ENCOUNTER — Other Ambulatory Visit: Payer: Self-pay | Admitting: Cardiovascular Disease

## 2015-09-05 ENCOUNTER — Ambulatory Visit: Payer: 59 | Admitting: Pharmacist Clinician (PhC)/ Clinical Pharmacy Specialist

## 2015-09-19 ENCOUNTER — Ambulatory Visit: Payer: 59 | Admitting: Pharmacist Clinician (PhC)/ Clinical Pharmacy Specialist

## 2015-10-01 ENCOUNTER — Telehealth: Payer: Self-pay | Admitting: Pharmacist Clinician (PhC)/ Clinical Pharmacy Specialist

## 2015-10-01 NOTE — Telephone Encounter (Signed)
Close encounter 

## 2015-11-29 ENCOUNTER — Other Ambulatory Visit: Payer: Self-pay | Admitting: Cardiovascular Disease

## 2016-02-13 ENCOUNTER — Other Ambulatory Visit: Payer: Self-pay | Admitting: Pharmacist Clinician (PhC)/ Clinical Pharmacy Specialist

## 2016-02-13 ENCOUNTER — Ambulatory Visit (INDEPENDENT_AMBULATORY_CARE_PROVIDER_SITE_OTHER): Payer: 59 | Admitting: Pharmacist Clinician (PhC)/ Clinical Pharmacy Specialist

## 2016-02-13 ENCOUNTER — Ambulatory Visit: Payer: 59 | Admitting: Pharmacist Clinician (PhC)/ Clinical Pharmacy Specialist

## 2016-02-13 DIAGNOSIS — I82409 Acute embolism and thrombosis of unspecified deep veins of unspecified lower extremity: Secondary | ICD-10-CM | POA: Diagnosis not present

## 2016-02-13 DIAGNOSIS — Z7901 Long term (current) use of anticoagulants: Secondary | ICD-10-CM | POA: Diagnosis not present

## 2016-02-13 LAB — POCT INR: INR: 2.6

## 2016-02-13 MED ORDER — WARFARIN SODIUM 5 MG PO TABS: ORAL_TABLET | ORAL | Status: DC

## 2016-04-09 ENCOUNTER — Encounter: Payer: 59 | Admitting: Pharmacist Clinician (PhC)/ Clinical Pharmacy Specialist

## 2016-05-23 ENCOUNTER — Other Ambulatory Visit: Payer: Self-pay | Admitting: Cardiovascular Disease

## 2016-06-29 ENCOUNTER — Telehealth: Payer: Self-pay | Admitting: Pharmacist

## 2016-06-29 MED ORDER — WARFARIN SODIUM 5 MG PO TABS: ORAL_TABLET | ORAL | 0 refills | Status: DC

## 2016-06-29 NOTE — Telephone Encounter (Signed)
Called and schedule to come in for INR check - she can only come on Fridays, Appt made for 8/25.   Will send 30 day supply until visit.

## 2016-07-09 ENCOUNTER — Ambulatory Visit (INDEPENDENT_AMBULATORY_CARE_PROVIDER_SITE_OTHER): Payer: 59 | Admitting: Pharmacist Clinician (PhC)/ Clinical Pharmacy Specialist

## 2016-07-09 DIAGNOSIS — I82409 Acute embolism and thrombosis of unspecified deep veins of unspecified lower extremity: Secondary | ICD-10-CM

## 2016-07-09 DIAGNOSIS — Z7901 Long term (current) use of anticoagulants: Secondary | ICD-10-CM | POA: Diagnosis not present

## 2016-07-09 LAB — POCT INR: INR: 2.6

## 2016-07-27 ENCOUNTER — Other Ambulatory Visit: Payer: Self-pay | Admitting: Cardiovascular Disease

## 2016-08-31 ENCOUNTER — Other Ambulatory Visit: Payer: Self-pay | Admitting: Cardiovascular Disease

## 2016-09-03 ENCOUNTER — Ambulatory Visit (INDEPENDENT_AMBULATORY_CARE_PROVIDER_SITE_OTHER): Payer: 59 | Admitting: Pharmacist Clinician (PhC)/ Clinical Pharmacy Specialist

## 2016-09-03 DIAGNOSIS — Z7901 Long term (current) use of anticoagulants: Secondary | ICD-10-CM | POA: Diagnosis not present

## 2016-09-03 LAB — POCT INR: INR: 2

## 2016-10-17 ENCOUNTER — Other Ambulatory Visit: Payer: Self-pay | Admitting: Cardiovascular Disease

## 2016-10-28 ENCOUNTER — Ambulatory Visit (INDEPENDENT_AMBULATORY_CARE_PROVIDER_SITE_OTHER): Payer: 59 | Admitting: Pharmacist

## 2016-10-28 DIAGNOSIS — Z7901 Long term (current) use of anticoagulants: Secondary | ICD-10-CM

## 2016-10-28 LAB — POCT INR: INR: 1.6

## 2016-11-22 ENCOUNTER — Other Ambulatory Visit: Payer: Self-pay | Admitting: Cardiovascular Disease

## 2016-12-30 ENCOUNTER — Other Ambulatory Visit: Payer: Self-pay | Admitting: Cardiovascular Disease

## 2017-02-19 ENCOUNTER — Other Ambulatory Visit: Payer: Self-pay | Admitting: Cardiovascular Disease

## 2017-02-21 NOTE — Telephone Encounter (Signed)
INR overdue since 12/10/16.  Patient cancelled appoitnment   LMOM; Need to schedule INR prior to next refill

## 2017-04-04 ENCOUNTER — Other Ambulatory Visit: Payer: Self-pay | Admitting: Cardiovascular Disease

## 2017-04-04 NOTE — Telephone Encounter (Signed)
INR overdue since January/2018.  Refill after visit on 04/08/17

## 2017-04-08 ENCOUNTER — Ambulatory Visit (INDEPENDENT_AMBULATORY_CARE_PROVIDER_SITE_OTHER): Payer: 59 | Admitting: Pharmacist Clinician (PhC)/ Clinical Pharmacy Specialist

## 2017-04-08 ENCOUNTER — Other Ambulatory Visit: Payer: Self-pay | Admitting: Pharmacist Clinician (PhC)/ Clinical Pharmacy Specialist

## 2017-04-08 DIAGNOSIS — Z7901 Long term (current) use of anticoagulants: Secondary | ICD-10-CM

## 2017-04-08 MED ORDER — WARFARIN SODIUM 5 MG PO TABS: ORAL_TABLET | ORAL | 1 refills | Status: DC

## 2017-06-03 ENCOUNTER — Other Ambulatory Visit: Payer: Self-pay | Admitting: Cardiovascular Disease

## 2017-06-03 NOTE — Telephone Encounter (Signed)
INR check overdue by > 1 month  Will refill for 15 days only. Need f/u with coumadin clinic prior to next refill authorization

## 2017-06-10 ENCOUNTER — Ambulatory Visit (INDEPENDENT_AMBULATORY_CARE_PROVIDER_SITE_OTHER): Payer: 59 | Admitting: Pharmacist Clinician (PhC)/ Clinical Pharmacy Specialist

## 2017-06-10 DIAGNOSIS — Z7901 Long term (current) use of anticoagulants: Secondary | ICD-10-CM

## 2017-06-10 LAB — POCT INR: INR: 2.3

## 2017-06-17 ENCOUNTER — Other Ambulatory Visit: Payer: Self-pay | Admitting: Cardiovascular Disease

## 2017-07-28 ENCOUNTER — Other Ambulatory Visit: Payer: Self-pay | Admitting: Cardiovascular Disease

## 2017-07-29 ENCOUNTER — Ambulatory Visit: Payer: 59 | Admitting: Cardiovascular Disease

## 2017-09-30 ENCOUNTER — Ambulatory Visit (INDEPENDENT_AMBULATORY_CARE_PROVIDER_SITE_OTHER): Payer: 59 | Admitting: Pharmacist

## 2017-09-30 DIAGNOSIS — Z7901 Long term (current) use of anticoagulants: Secondary | ICD-10-CM

## 2017-09-30 LAB — POCT INR: INR: 1

## 2017-09-30 MED ORDER — WARFARIN SODIUM 5 MG PO TABS: ORAL_TABLET | ORAL | 0 refills | Status: DC

## 2017-10-04 ENCOUNTER — Telehealth: Payer: Self-pay | Admitting: Cardiovascular Disease

## 2017-10-04 NOTE — Telephone Encounter (Signed)
New message      1. What dental office are you calling from? Dr Milagros LollSpurrier [facility]   2. What is your office phone and fax number? [phone] fax [phone]  3. What type of procedure is the patient having performed? 2 tabs put into bone for dental procedure   4. What date is procedure scheduled or is the patient there now?  12/01/17   5. What is your question (ex. Antibiotics prior to procedure, holding medication-we need to know how long dentist wants pt to hold med)?  Needs advise for premed if needed and coumadin

## 2017-10-05 NOTE — Telephone Encounter (Signed)
Patient overdue for f/u with MD. Appointment schedule for 07/29/2017 was cancelled. Poor compliance with warfarin monitoring also (gap in INR check from July/2018 to Nov/2018). Most recent INR was 1.0 on Nov/16/2018.   Patient with Hx of DVT need to hold warfarin 3-5 days prior to procedure. NO history of endocarditis of valve problems noted; therefore, no pre-op antibiotics recommended at this time.  Patient should keep coumadin clinic appointment on Dec/14/2018 for further instructions.  Addalie Calles Rodriguez-Guzman PharmD, BCPS, CPP [facility] [address] [place] [id number] 10/05/2017 8:35 AM

## 2017-10-05 NOTE — Telephone Encounter (Signed)
Called pt re: preop clearance for upcoming dental procedure.  Pt hasn't been seen in years and will need a new pt appt before she can just be cleared.  I left a message for pt to call back.

## 2017-10-05 NOTE — Telephone Encounter (Signed)
    Chart reviewed as part of pre-operative protocol coverage. Because of Gabriella Carey's past medical history and time since last visit, he/she will require a follow-up visit in order to better assess preoperative cardiovascular risk.   Per pharmacy notes, pt missed scheduled visit 07/2017 with Dr. Allyson SabalBerry, never rescheduled. I do not see any prior office visits with cardiology in our system but prior phone notes indicate  primary cardiologist is Dr. Allyson SabalBerry. I am not sure if perhaps she was seen prior to initiation of Epic. Regardless, given extreme length of time since last seen, needs appointment with provider for re-establishment of care.  Pre-op covering staff: - Please schedule appointment and call patient to inform them. - Please contact requesting surgeon's office via preferred method (i.e, phone, fax) to inform them of need for appointment prior to surgery.  Coumadin clinic has already scheduled f/u.  Gabriella Montanaayna N Dunn, PA-C  10/05/2017, 1:33 PM

## 2017-10-10 NOTE — Telephone Encounter (Signed)
Left message advising patient to contact office to schedule an appt for medical clearance for upcoming surgery.

## 2017-10-12 NOTE — Telephone Encounter (Signed)
ERROR

## 2017-10-12 NOTE — Telephone Encounter (Signed)
lmtcb

## 2017-10-12 NOTE — Telephone Encounter (Signed)
Follow up     Patient schedule for 10/28/17 930a with Theodore Demarkhonda Barrett for surgical clearance

## 2017-10-28 ENCOUNTER — Other Ambulatory Visit: Payer: Self-pay | Admitting: Cardiovascular Disease

## 2017-10-28 ENCOUNTER — Encounter: Payer: Self-pay | Admitting: Physician Assistant

## 2017-10-28 ENCOUNTER — Ambulatory Visit: Payer: 59 | Admitting: Physician Assistant

## 2017-10-28 ENCOUNTER — Ambulatory Visit (INDEPENDENT_AMBULATORY_CARE_PROVIDER_SITE_OTHER): Payer: 59 | Admitting: Pharmacist

## 2017-10-28 VITALS — BP 131/83 | HR 73 | Ht 68.0 in | Wt 256.7 lb

## 2017-10-28 DIAGNOSIS — Z7901 Long term (current) use of anticoagulants: Secondary | ICD-10-CM

## 2017-10-28 DIAGNOSIS — Z01810 Encounter for preprocedural cardiovascular examination: Secondary | ICD-10-CM | POA: Diagnosis not present

## 2017-10-28 DIAGNOSIS — I825Z1 Chronic embolism and thrombosis of unspecified deep veins of right distal lower extremity: Secondary | ICD-10-CM

## 2017-10-28 LAB — POCT INR: INR: 1.8

## 2017-10-28 MED ORDER — WARFARIN SODIUM 5 MG PO TABS: ORAL_TABLET | ORAL | 0 refills | Status: DC

## 2017-10-28 MED ORDER — APIXABAN 5 MG PO TABS: 5.0000 mg | ORAL_TABLET | Freq: Two times a day (BID) | ORAL | 11 refills | Status: DC

## 2017-10-28 NOTE — Progress Notes (Signed)
Cardiology Office Note   Date:  10/28/2017   ID:  Gabriella Carey, DOB December 16, 1975, MRN [id number]  PCP:  Marlinda MikeBailey, Tanya, CNM  Cardiologist:  Dr Allyson SabalBerry? 2015  Theodore Demarkhonda Lucy Woolever, PA-C   Chief Complaint  Patient presents with  . Follow-up    pt is a pt of Dr Allyson SabalBerry, haven't seen him in a while, pt denied chest pain and SOB    History of Present Illness: Gabriella Carey is a 41 y.o. female with a history of HTN, DVT 2010 after pregnancy on coumadin w/ poor compliance w/ f/u appts   11/20 phone note describing need for dental procedure, cardiac clearance requested  Gabriella Carey presents for cardiology evaluation.  She will be doing Invisiline and they need to place anchors first.   She never has chest pain or SOB. She has some RLE edema that is chronic, but mostly daytime.  She denies orthopnea or PND.   She does not exercise.  She never has palpitations, no hx presyncope or syncope.   She gets a yearly physical with labs, it is time to schedule this.  Her periods are getting more frequent, not heavier. She is considering an endometrial ablation.   She had problems with increased menstrual bleeding on Xarelto, does better with coumadin.    Past Medical History:  Diagnosis Date  . 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 Factor V Leiden mutation [facility](HCC)     Past Surgical History:  Procedure Laterality Date  . CESAREAN SECTION  2010  . DOPPER R LOWER EXTR ([facility] HX)  04/18/2009   thrombus right popliteal vein    Current Outpatient Medications  Medication Sig Dispense Refill  . buPROPion (WELLBUTRIN XL) 150 MG 24 hr tablet Take 150 mg by mouth every morning.  0  . hydrochlorothiazide (HYDRODIURIL) 25 MG tablet Take 25 mg by mouth daily.    . metoprolol succinate (TOPROL-XL) 50 MG 24 hr tablet Take 50 mg by mouth daily. Take with or immediately following a meal.    . warfarin (COUMADIN) 5 MG tablet TAKE 1 OR 2 TABLETS  BY MOUTH DAILY AS DIRECTED BY COUMADIN CLINIC 60 tablet 0   No current facility-administered medications for this visit.     Allergies:   Patient has no known allergies.    Social History:  The patient  reports that  has never smoked. she has never used smokeless tobacco. She reports that she drinks about 1.8 oz of alcohol per week. She reports that she does not use drugs.   Family History:  The patient's family history includes Diabetes in her father; Factor V Leiden deficiency in her father; Hypertension in her mother.    ROS:  Please see the history of present illness. All other systems are reviewed and negative.    PHYSICAL EXAM: VS:  BP 131/83   Pulse 73   Ht 5\' 8"  (1.727 m)   Wt 256 lb 11.2 oz (116.4 kg)   BMI 39.03 kg/m  , BMI Body mass index is 39.03 kg/m. GEN: Well nourished, well developed, female in no acute distress  HEENT: normal for age  Neck: no JVD, no carotid bruit, no masses Cardiac: RRR; no murmur, no rubs, or gallops Respiratory:  clear to auscultation bilaterally, normal work of breathing GI: soft, nontender, nondistended, + BS MS: no deformity or atrophy; trace R edema; distal pulses are 2+ in all 4 extremities   Skin:  warm and dry, no rash Neuro:  Strength and sensation are intact Psych: euthymic mood, full affect   EKG:  EKG is ordered today. The ekg ordered today demonstrates SR, HR 73, no acute ischemic changes, no old in system.  RLE DOPPLERS: 01/18/2014 Chronic R popliteal DVT, no other DVT seen   Recent Labs: No results found for requested labs within last 8760 hours.    Lipid Panel No results found for: CHOL, TRIG, HDL, CHOLHDL, VLDL, LDLCALC, LDLDIRECT   Wt Readings from Last 3 Encounters:  10/28/17 256 lb 11.2 oz (116.4 kg)     Other studies Reviewed: Additional studies/ records that were reviewed today include: office notes and testing.  ASSESSMENT AND PLAN: Pt and plan reviewed with Dr Royann Shiversroitoru, who agrees.   1.  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  She is not having any acute problems.  No need to repeat testing at this time, continue anticoagulation.  2. Chronic anticoagulation: Pt has Factor V Leiden deficiency. She cannot come off anticoagulation, but can hold Coumadin for the procedure, and then change from coumadin to Eliquis. Pt agreeable to trying Eliquis, she will change when she comes off the coumadin for her dental procedure.   3. Preop evaluation: Pt is at acceptable risk for the planned procedure without any additional cardiac workup.    Current medicines are reviewed at length with the patient today.  The patient does not have concerns regarding medicines.  The following changes have been made:  Change from coumadin to Eliquis in January  Labs/ tests ordered today include:   Orders Placed This Encounter  Procedures  . EKG 12-Lead     Disposition:   FU with Dr. Allyson SabalBerry, she wishes to keep him as her cardiologist.  Signed, Theodore Demarkhonda Breely Panik, PA-C  10/28/2017 10:21 AM    [facility] Phone: [phone]; Fax: [phone]  This note was written with the assistance of speech recognition software. Please excuse any transcriptional errors.

## 2017-10-28 NOTE — Patient Instructions (Signed)
Description   Take 10mg  today, then continue with 10 mg daily except 5 mg each Monday and Friday.  Repeat INR in 4 weeks - pending dental procedure on January 17th.

## 2017-10-28 NOTE — Patient Instructions (Addendum)
Medication Instructions:  Your physician recommends that you continue on your current medications as directed. Please refer to the Current Medication list given to you today.  If you need a refill on your cardiac medications before your next appointment, please call your pharmacy.  Special Instructions: ENCOURAGED TO INCREASE ACTIVITY LEVEL AS TOLERATED  PLEASE FOLLOW HEART HEALTHY DIET  CLEARED FOR PROCEDURE- STOP COUMADIN 5 DAYS BEFORE DENTAL PROCEDURE  AFTER DENTAL PROCEDURE, AFTER STOPPING COUMADIN START ELIQUIS TWICE DAILY  Follow-Up: Your physician wants you to follow-up in: 12 MONTHS WITH DR Allyson SabalBERRY You should receive a reminder letter in the mail two months in advance. If you do not receive a letter, please call our office October 2019 to schedule the December 2019 follow-up appointment.   [facility] you [facility] choosing [facility]

## 2017-11-25 ENCOUNTER — Ambulatory Visit (INDEPENDENT_AMBULATORY_CARE_PROVIDER_SITE_OTHER): Payer: 59 | Admitting: Pharmacist

## 2017-11-25 DIAGNOSIS — Z7901 Long term (current) use of anticoagulants: Secondary | ICD-10-CM

## 2017-11-25 DIAGNOSIS — D6851 Activated protein C resistance: Secondary | ICD-10-CM

## 2017-11-25 LAB — POCT INR: INR: 1.7

## 2017-12-01 ENCOUNTER — Other Ambulatory Visit: Payer: Self-pay | Admitting: Pharmacist

## 2017-12-01 MED ORDER — WARFARIN SODIUM 5 MG PO TABS: ORAL_TABLET | ORAL | 0 refills | Status: DC

## 2017-12-01 NOTE — Telephone Encounter (Signed)
Patient preference is to stay on warfarin for now. Will discuss apixaban use with PCP/OB-GYN.  Experience increased bleeding with Xarelto and not comfortable switching to Eliquis at this time.

## 2017-12-09 ENCOUNTER — Other Ambulatory Visit: Payer: Self-pay | Admitting: *Deleted

## 2017-12-09 MED ORDER — WARFARIN SODIUM 5 MG PO TABS: ORAL_TABLET | ORAL | 0 refills | Status: DC

## 2017-12-09 NOTE — Addendum Note (Signed)
Addended by: Pearletha FurlODRIGUEZ-GUZMAN, Oliveah Zwack on: 12/09/2017 04:32 PM   Modules accepted: Orders

## 2017-12-23 ENCOUNTER — Ambulatory Visit (INDEPENDENT_AMBULATORY_CARE_PROVIDER_SITE_OTHER): Payer: 59 | Admitting: Pharmacist Clinician (PhC)/ Clinical Pharmacy Specialist

## 2017-12-23 DIAGNOSIS — D6851 Activated protein C resistance: Secondary | ICD-10-CM | POA: Diagnosis not present

## 2017-12-23 DIAGNOSIS — Z7901 Long term (current) use of anticoagulants: Secondary | ICD-10-CM

## 2017-12-23 LAB — POCT INR: INR: 3

## 2018-02-06 ENCOUNTER — Other Ambulatory Visit: Payer: Self-pay | Admitting: Cardiovascular Disease

## 2018-03-13 ENCOUNTER — Other Ambulatory Visit: Payer: Self-pay | Admitting: Cardiovascular Disease

## 2018-03-17 ENCOUNTER — Other Ambulatory Visit: Payer: Self-pay | Admitting: Cardiovascular Disease

## 2018-07-04 ENCOUNTER — Telehealth: Payer: Self-pay | Admitting: Cardiovascular Disease

## 2018-07-04 NOTE — Telephone Encounter (Signed)
New Message:  Patient lives in [place] and wanted to know if there is a coumadin clinic [place]

## 2018-07-04 NOTE — Telephone Encounter (Signed)
Returned pt call. Lmtcb.  Called to let pt know she call the [place] office directly to schedule an appt with the Coumadin clinic there.

## 2018-07-05 ENCOUNTER — Telehealth: Payer: Self-pay | Admitting: Cardiovascular Disease

## 2018-07-05 NOTE — Telephone Encounter (Signed)
Called patient, LVM advising of message below. Left call back number if questions.

## 2018-07-05 NOTE — Telephone Encounter (Signed)
Error: No Note Needed

## 2018-07-12 ENCOUNTER — Ambulatory Visit (INDEPENDENT_AMBULATORY_CARE_PROVIDER_SITE_OTHER): Payer: 59 | Admitting: *Deleted

## 2018-07-12 DIAGNOSIS — D6851 Activated protein C resistance: Secondary | ICD-10-CM | POA: Diagnosis not present

## 2018-07-12 DIAGNOSIS — Z7901 Long term (current) use of anticoagulants: Secondary | ICD-10-CM | POA: Diagnosis not present

## 2018-07-12 DIAGNOSIS — I82401 Acute embolism and thrombosis of unspecified deep veins of right lower extremity: Secondary | ICD-10-CM | POA: Diagnosis not present

## 2018-07-12 LAB — POCT INR: INR: 1.1 — AB (ref 2.0–3.0)

## 2018-07-12 MED ORDER — WARFARIN SODIUM 5 MG PO TABS: ORAL_TABLET | ORAL | 2 refills | Status: DC

## 2018-07-12 NOTE — Patient Instructions (Signed)
Has been out of coumadin x 3 weeks Restart coumadin 10 mg daily except 5 mg each Monday.  Repeat IN[place] in 2 weeks in [place]

## 2018-07-25 ENCOUNTER — Ambulatory Visit (INDEPENDENT_AMBULATORY_CARE_PROVIDER_SITE_OTHER): Payer: 59 | Admitting: *Deleted

## 2018-07-25 DIAGNOSIS — D6851 Activated protein C resistance: Secondary | ICD-10-CM

## 2018-07-25 DIAGNOSIS — Z7901 Long term (current) use of anticoagulants: Secondary | ICD-10-CM | POA: Diagnosis not present

## 2018-07-25 LAB — POCT INR: INR: 2.6 (ref 2.0–3.0)

## 2018-07-25 NOTE — Patient Instructions (Signed)
Continue coumadin 2 tablets daily except 1 tablet on Mondays Recheck in 4 weeks 

## 2018-08-22 ENCOUNTER — Ambulatory Visit (INDEPENDENT_AMBULATORY_CARE_PROVIDER_SITE_OTHER): Payer: 59 | Admitting: *Deleted

## 2018-08-22 DIAGNOSIS — D6851 Activated protein C resistance: Secondary | ICD-10-CM | POA: Diagnosis not present

## 2018-08-22 DIAGNOSIS — Z7901 Long term (current) use of anticoagulants: Secondary | ICD-10-CM

## 2018-08-22 LAB — POCT INR: INR: 1.4 — AB (ref 2.0–3.0)

## 2018-08-22 NOTE — Patient Instructions (Signed)
Take coumadin 3 tablets tonight and tomorrow night then resume 2 tablets daily except 1 tablet on Mondays Recheck in 2 weeks

## 2018-10-11 NOTE — Progress Notes (Signed)
Called pt to schedule overdue inr. Pt stated that they are having inr checked in the [facility] [place] office This encounter was created in error - please disregard.

## 2018-10-23 ENCOUNTER — Other Ambulatory Visit: Payer: Self-pay | Admitting: Cardiovascular Disease

## 2018-11-23 ENCOUNTER — Other Ambulatory Visit: Payer: Self-pay | Admitting: Cardiovascular Disease

## 2018-12-01 ENCOUNTER — Other Ambulatory Visit: Payer: Self-pay | Admitting: Cardiovascular Disease

## 2018-12-01 NOTE — Telephone Encounter (Signed)
Called pt left msg no refills for coumadin will be authorized until pt is seen in the coumadin clinic

## 2019-01-11 ENCOUNTER — Ambulatory Visit (INDEPENDENT_AMBULATORY_CARE_PROVIDER_SITE_OTHER): Payer: 59 | Admitting: *Deleted

## 2019-01-11 DIAGNOSIS — Z7901 Long term (current) use of anticoagulants: Secondary | ICD-10-CM | POA: Diagnosis not present

## 2019-01-11 LAB — POCT INR: INR: 1 — AB (ref 2.0–3.0)

## 2019-01-11 MED ORDER — WARFARIN SODIUM 5 MG PO TABS: ORAL_TABLET | ORAL | 0 refills | Status: DC

## 2019-01-11 NOTE — Patient Instructions (Signed)
Has been without coumadin since the first of the month.  States" It's my fault, I Know better".  Promises to get back on track. Restart coumadin 2 tablets daily except 1 tablet on Mondays New Rx sent to [facility] Recheck in 3 weeks Needs 7:30am appt due to work

## 2019-02-14 ENCOUNTER — Telehealth: Payer: Self-pay | Admitting: *Deleted

## 2019-02-14 NOTE — Telephone Encounter (Signed)

## 2019-02-15 ENCOUNTER — Ambulatory Visit (INDEPENDENT_AMBULATORY_CARE_PROVIDER_SITE_OTHER): Payer: 59 | Admitting: *Deleted

## 2019-02-15 ENCOUNTER — Other Ambulatory Visit: Payer: Self-pay

## 2019-02-15 DIAGNOSIS — D6851 Activated protein C resistance: Secondary | ICD-10-CM | POA: Diagnosis not present

## 2019-02-15 DIAGNOSIS — Z7901 Long term (current) use of anticoagulants: Secondary | ICD-10-CM | POA: Diagnosis not present

## 2019-02-15 LAB — POCT INR: INR: 4.1 — AB (ref 2.0–3.0)

## 2019-02-15 NOTE — Patient Instructions (Signed)
NEEDS APPT WITH CARDIOLOGIST.  WILL MAKE AFTER COVID 19 Hold coumadin tonight then resume 2 tablets daily except 1 tablet on Mondays Recheck in 3 weeks Needs 7:30am appt due to work

## 2019-03-07 ENCOUNTER — Telehealth: Payer: Self-pay | Admitting: *Deleted

## 2019-03-07 NOTE — Telephone Encounter (Signed)
° °  _____________   COVID-19 Pre-Screening Questions:   Do you currently have a fever? yes = cancel and refer to pcp for e-visit)  No   Have you recently travelled on a cruise, internationally, or to [place], [place], [place], [place], [place], or [place], [place] [employer]) ?  (yes = cancel, stay home, monitor symptoms, and contact pcp or initiate e-visit if symptoms develop) no  Have you been in contact with someone that is currently pending confirmation of Covid19 testing or has been confirmed to have the Covid19 virus?  (yes = cancel, stay home, away from tested individual, monitor symptoms, and contact pcp or initiate e-visit if symptoms develop)   no  Are you currently experiencing fatigue or cough?  (yes = pt should be prepared to have a mask placed at the time of their visit).   no

## 2019-03-08 ENCOUNTER — Ambulatory Visit (INDEPENDENT_AMBULATORY_CARE_PROVIDER_SITE_OTHER): Payer: 59 | Admitting: *Deleted

## 2019-03-08 DIAGNOSIS — D6851 Activated protein C resistance: Secondary | ICD-10-CM | POA: Diagnosis not present

## 2019-03-08 DIAGNOSIS — Z7901 Long term (current) use of anticoagulants: Secondary | ICD-10-CM | POA: Diagnosis not present

## 2019-03-08 LAB — POCT INR: INR: 4.1 — AB (ref 2.0–3.0)

## 2019-03-08 NOTE — Patient Instructions (Signed)
NEEDS APPT WITH CARDIOLOGIST.  WILL MAKE AFTER COVID 19 Hold coumadin tonight then decrease dose to 2 tablets daily except 1 tablet on Mondays and Thursdays Recheck in 3 weeks Needs 7:30am appt due to work

## 2019-03-27 ENCOUNTER — Ambulatory Visit (INDEPENDENT_AMBULATORY_CARE_PROVIDER_SITE_OTHER): Payer: 59 | Admitting: *Deleted

## 2019-03-27 ENCOUNTER — Other Ambulatory Visit: Payer: Self-pay

## 2019-03-27 DIAGNOSIS — D6851 Activated protein C resistance: Secondary | ICD-10-CM

## 2019-03-27 DIAGNOSIS — I82499 Acute embolism and thrombosis of other specified deep vein of unspecified lower extremity: Secondary | ICD-10-CM

## 2019-03-27 DIAGNOSIS — Z7901 Long term (current) use of anticoagulants: Secondary | ICD-10-CM

## 2019-03-27 LAB — POCT INR: INR: 2.8 (ref 2.0–3.0)

## 2019-03-27 NOTE — Patient Instructions (Signed)
Continue coumadin 2 tablets daily except 1 tablet on Mondays and Thursdays Recheck in 4 weeks Needs 7:30am appt due to work

## 2019-04-06 ENCOUNTER — Other Ambulatory Visit: Payer: Self-pay | Admitting: Cardiovascular Disease

## 2019-05-22 ENCOUNTER — Ambulatory Visit (INDEPENDENT_AMBULATORY_CARE_PROVIDER_SITE_OTHER): Payer: 59 | Admitting: *Deleted

## 2019-05-22 DIAGNOSIS — Z7901 Long term (current) use of anticoagulants: Secondary | ICD-10-CM

## 2019-05-22 DIAGNOSIS — I82401 Acute embolism and thrombosis of unspecified deep veins of right lower extremity: Secondary | ICD-10-CM

## 2019-05-22 DIAGNOSIS — D6851 Activated protein C resistance: Secondary | ICD-10-CM | POA: Diagnosis not present

## 2019-05-22 LAB — POCT INR: INR: 4.4 — AB (ref 2.0–3.0)

## 2019-05-22 NOTE — Patient Instructions (Signed)
NEEDS APPT WITH CARDIOLOGIST.  WILL MAKE AFTER COVID 19 Hold coumadin tonight then resume 2 tablets daily except 1 tablet on Mondays and Thursdays Recheck in 4 weeks. Pt will call for appt.

## 2019-07-06 ENCOUNTER — Other Ambulatory Visit: Payer: Self-pay | Admitting: Cardiovascular Disease

## 2019-07-06 NOTE — Telephone Encounter (Signed)
Please review for refill. Thank you! 

## 2019-08-07 ENCOUNTER — Other Ambulatory Visit: Payer: Self-pay

## 2019-08-07 ENCOUNTER — Ambulatory Visit (INDEPENDENT_AMBULATORY_CARE_PROVIDER_SITE_OTHER): Payer: 59 | Admitting: *Deleted

## 2019-08-07 DIAGNOSIS — D6851 Activated protein C resistance: Secondary | ICD-10-CM

## 2019-08-07 DIAGNOSIS — Z7901 Long term (current) use of anticoagulants: Secondary | ICD-10-CM | POA: Diagnosis not present

## 2019-08-07 LAB — POCT INR: INR: 2.8 (ref 2.0–3.0)

## 2019-08-07 NOTE — Patient Instructions (Signed)
Continue coumadin 2 tablets daily except 1 tablet on Mondays and Thursdays Recheck in 6 weeks.

## 2019-09-21 ENCOUNTER — Ambulatory Visit: Payer: 59 | Admitting: Physician Assistant

## 2019-10-18 ENCOUNTER — Ambulatory Visit (INDEPENDENT_AMBULATORY_CARE_PROVIDER_SITE_OTHER): Payer: 59 | Admitting: *Deleted

## 2019-10-18 ENCOUNTER — Other Ambulatory Visit: Payer: Self-pay

## 2019-10-18 DIAGNOSIS — D6851 Activated protein C resistance: Secondary | ICD-10-CM | POA: Diagnosis not present

## 2019-10-18 DIAGNOSIS — Z7901 Long term (current) use of anticoagulants: Secondary | ICD-10-CM

## 2019-10-18 LAB — POCT INR: INR: 1.9 — AB (ref 2.0–3.0)

## 2019-10-18 NOTE — Patient Instructions (Signed)
Take 3 tablets tonight then resume 2 tablets daily except 1 tablet on Mondays and Thursdays Recheck in 6 weeks.

## 2019-11-13 ENCOUNTER — Ambulatory Visit: Payer: 59 | Attending: Internal Medicine

## 2019-11-13 ENCOUNTER — Other Ambulatory Visit: Payer: Self-pay

## 2019-11-13 DIAGNOSIS — Z20822 Contact with and (suspected) exposure to covid-19: Secondary | ICD-10-CM

## 2019-11-14 ENCOUNTER — Other Ambulatory Visit: Payer: 59

## 2019-11-14 LAB — NOVEL CORONAVIRUS, NAA: SARS-CoV-2, NAA: DETECTED — AB

## 2019-11-15 ENCOUNTER — Encounter: Payer: Self-pay | Admitting: *Deleted

## 2019-11-29 ENCOUNTER — Ambulatory Visit (INDEPENDENT_AMBULATORY_CARE_PROVIDER_SITE_OTHER): Payer: 59 | Admitting: *Deleted

## 2019-11-29 ENCOUNTER — Other Ambulatory Visit: Payer: Self-pay

## 2019-11-29 DIAGNOSIS — D6851 Activated protein C resistance: Secondary | ICD-10-CM

## 2019-11-29 DIAGNOSIS — Z7901 Long term (current) use of anticoagulants: Secondary | ICD-10-CM | POA: Diagnosis not present

## 2019-11-29 LAB — POCT INR: INR: 3 (ref 2.0–3.0)

## 2019-11-29 NOTE — Patient Instructions (Signed)
Continue warfarin 2 tablets daily except 1 tablet on Mondays and Thursdays ?Recheck in 6 weeks ?

## 2020-01-10 ENCOUNTER — Ambulatory Visit (INDEPENDENT_AMBULATORY_CARE_PROVIDER_SITE_OTHER): Payer: 59 | Admitting: *Deleted

## 2020-01-10 ENCOUNTER — Other Ambulatory Visit: Payer: Self-pay

## 2020-01-10 DIAGNOSIS — Z7901 Long term (current) use of anticoagulants: Secondary | ICD-10-CM

## 2020-01-10 DIAGNOSIS — D6851 Activated protein C resistance: Secondary | ICD-10-CM

## 2020-01-10 LAB — POCT INR: INR: 3.3 — AB (ref 2.0–3.0)

## 2020-01-10 NOTE — Patient Instructions (Signed)
Hold warfarin tonight then resume 2 tablets daily except 1 tablet on Mondays and Thursdays Increase greens Recheck in 6 weeks.

## 2020-02-21 ENCOUNTER — Other Ambulatory Visit: Payer: Self-pay

## 2020-02-21 ENCOUNTER — Ambulatory Visit (INDEPENDENT_AMBULATORY_CARE_PROVIDER_SITE_OTHER): Payer: 59 | Admitting: *Deleted

## 2020-02-21 DIAGNOSIS — Z7901 Long term (current) use of anticoagulants: Secondary | ICD-10-CM

## 2020-02-21 DIAGNOSIS — I82402 Acute embolism and thrombosis of unspecified deep veins of left lower extremity: Secondary | ICD-10-CM

## 2020-02-21 DIAGNOSIS — D6851 Activated protein C resistance: Secondary | ICD-10-CM

## 2020-02-21 LAB — POCT INR: INR: 2.2 (ref 2.0–3.0)

## 2020-02-21 NOTE — Patient Instructions (Signed)
Continue warfarin 2 tablets daily except 1 tablet on Mondays and Thursdays Increase greens Recheck in 6 weeks.

## 2020-03-31 ENCOUNTER — Encounter (HOSPITAL_COMMUNITY): Payer: Self-pay | Admitting: Emergency Medicine

## 2020-03-31 ENCOUNTER — Emergency Department (HOSPITAL_COMMUNITY): Payer: 59

## 2020-03-31 ENCOUNTER — Emergency Department (HOSPITAL_COMMUNITY)
Admission: EM | Admit: 2020-03-31 | Discharge: 2020-03-31 | Disposition: A | Discharge status: Home / Self Care | Payer: 59 | Attending: Emergency Medicine | Admitting: Emergency Medicine

## 2020-03-31 ENCOUNTER — Other Ambulatory Visit: Payer: Self-pay

## 2020-03-31 DIAGNOSIS — Y92014 Private driveway to single-family (private) house as the place of occurrence of the external cause: Secondary | ICD-10-CM | POA: Insufficient documentation

## 2020-03-31 DIAGNOSIS — W01198A Fall on same level from slipping, tripping and stumbling with subsequent striking against other object, initial encounter: Secondary | ICD-10-CM | POA: Diagnosis not present

## 2020-03-31 DIAGNOSIS — S022XXA Fracture of nasal bones, initial encounter for closed fracture: Secondary | ICD-10-CM | POA: Diagnosis not present

## 2020-03-31 DIAGNOSIS — Y998 Other external cause status: Secondary | ICD-10-CM | POA: Insufficient documentation

## 2020-03-31 DIAGNOSIS — Y9302 Activity, running: Secondary | ICD-10-CM | POA: Insufficient documentation

## 2020-03-31 DIAGNOSIS — Z23 Encounter for immunization: Secondary | ICD-10-CM | POA: Insufficient documentation

## 2020-03-31 DIAGNOSIS — S0990XA Unspecified injury of head, initial encounter: Secondary | ICD-10-CM

## 2020-03-31 DIAGNOSIS — W19XXXA Unspecified fall, initial encounter: Secondary | ICD-10-CM

## 2020-03-31 MED ORDER — TETANUS-DIPHTH-ACELL PERTUSSIS 5-2.5-18.5 LF-MCG/0.5 IM SUSP
0.5000 mL | Freq: Once | INTRAMUSCULAR | Status: AC
  Administered 2020-03-31: 0.5 mL via INTRAMUSCULAR
  Filled 2020-03-31: qty 0.5

## 2020-03-31 NOTE — ED Triage Notes (Signed)
Racing daughter today and lost footing.  Injury to bridge of nose, eft hand and upper lip.  Rates pain 6/10.  Denies LOC.  Pt is on Warfarin qd.

## 2020-03-31 NOTE — ED Provider Notes (Signed)
Emergency Department Provider Note   I have reviewed the triage vital signs and the nursing notes.   HISTORY  Chief Complaint Fall   HPI Gabriella Carey is a 44 y.o. female with PMH of factor V Leiden on Coumadin presents to the ED after fall at home.  Patient states that she was racing her daughter in the driveway when she tripped and fell forward landing on her face and hand.  She did not lose consciousness.  She has pain over the bridge of her nose with swelling and some bruising.  No epistaxis.  No confusion or vomiting.  Denies any loose or obvious chipped teeth.  No chest pain or shortness of breath.  No abdominal or back pain.  She did sustain an abrasion to the left palm but denies any pain with moving the wrist, fingers, elbow, shoulder. No pain in the legs.   Past Medical History:  Diagnosis Date  . 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 Factor V Leiden mutation [facility])     Patient Active Problem List   Diagnosis Date Noted  . Factor V Leiden mutation (HCC) 11/25/2017  . Freyja Govea term (current) use of anticoagulants 01/21/2013  . DVT (deep venous thrombosis), unspecified laterality 01/21/2013    Past Surgical History:  Procedure Laterality Date  . CESAREAN SECTION  2010  . DOPPER R LOWER EXTR ([facility] HX)  04/18/2009   thrombus right popliteal vein    Allergies Patient has no known allergies.  Family History  Problem Relation Age of Onset  . Hypertension Mother   . Diabetes Father   . Factor V Leiden deficiency Father     Social History Social History   Tobacco Use  . Smoking status: Never Smoker  . Smokeless tobacco: Never Used  Substance Use Topics  . Alcohol use: Yes    Alcohol/week: 3.0 standard drinks    Types: 3 Glasses of wine per week  . Drug use: No    Review of Systems  Constitutional: No fever/chills Eyes: No visual changes. ENT: No sore throat. Nose pain with bruising after fall.    Cardiovascular: Denies chest pain. Respiratory: Denies shortness of breath. Gastrointestinal: No abdominal pain.  No nausea, no vomiting.  No diarrhea.  No constipation. Genitourinary: Negative for dysuria. Musculoskeletal: Negative for back pain. Skin: Negative for rash. Abrasion to the left palm.  Neurological: Negative for focal weakness or numbness. Mild frontal HA.   10-point ROS otherwise negative.  ____________________________________________   PHYSICAL EXAM:  VITAL SIGNS: ED Triage Vitals  Enc Vitals Group     BP 03/31/20 1701 (!) 150/96     Pulse Rate 03/31/20 1701 69     Resp 03/31/20 1701 16     Temp 03/31/20 1701 98.1 F (36.7 C)     Temp Source 03/31/20 1701 Oral     SpO2 03/31/20 1701 100 %     Weight 03/31/20 1702 227 lb (103 kg)     Height 03/31/20 1702 5\' 8"  (1.727 m)   Constitutional: Alert and oriented. Well appearing and in no acute distress. Eyes: Conjunctivae are normal. PERRL. Head: Atraumatic. Nose: No congestion/rhinnorhea. No septal hematoma. Bruising to the nasal bridge with swelling.  Mouth/Throat: Mucous membranes are moist.  Neck: No stridor. No cervical spine tenderness to palpation. Cardiovascular: Normal rate, regular rhythm. Good peripheral circulation. Grossly normal heart sounds.   Respiratory: Normal respiratory effort.  No retractions. Lungs CTAB. Gastrointestinal: Soft  and nontender. No distention.  Musculoskeletal: No lower extremity tenderness nor edema. No gross deformities of extremities. Neurologic:  Normal speech and language. No gross focal neurologic deficits are appreciated.  Skin:  Skin is warm, dry and intact. No rash noted.  ____________________________________________  RADIOLOGY  CT Head Wo Contrast  Result Date: 03/31/2020 CLINICAL DATA:  Head injury, lost footing with injury to bridge of nose and upper lip. No loss of consciousness. EXAM: CT HEAD WITHOUT CONTRAST TECHNIQUE: Contiguous axial images were obtained  from the base of the skull through the vertex without intravenous contrast. COMPARISON:  None. FINDINGS: Brain: No intracranial hemorrhage, mass effect, or midline shift. No hydrocephalus. The basilar cisterns are patent. No evidence of territorial infarct or acute ischemia. No extra-axial or intracranial fluid collection. Vascular: No hyperdense vessel or unexpected calcification. Skull: No fracture or focal lesion. Sinuses/Orbits: Assessed on concurrent face CT, performed concurrently and reported separately. Other: None. IMPRESSION: Negative head CT. Electronically Signed   By: Narda Rutherford M.D.   On: 03/31/2020 20:13   CT Maxillofacial Wo Contrast  Result Date: 03/31/2020 CLINICAL DATA:  Lost footing leading to fall. Injury to bridge of nose and upper lip. EXAM: CT MAXILLOFACIAL WITHOUT CONTRAST TECHNIQUE: Multidetector CT imaging of the maxillofacial structures was performed. Multiplanar CT image reconstructions were also generated. COMPARISON:  None. FINDINGS: Osseous: Nondisplaced nasal bridge fracture. Mega medic arches, mandibles, and pterygoid plates are intact. The temporomandibular joints are congruent. Nasal septum is midline. Orbits: No orbital fracture. Both orbits and globes are intact. Sinuses: No sinus fracture or fluid level. No significant mucosal thickening. Mastoid air cells are clear. Soft tissues: Negative. Limited intracranial: Assessed on concurrent head CT, reported separately. IMPRESSION: Nondisplaced nasal bridge fracture. Electronically Signed   By: Narda Rutherford M.D.   On: 03/31/2020 20:17    ____________________________________________   PROCEDURES  Procedure(s) performed:   Procedures  None  ____________________________________________   INITIAL IMPRESSION / ASSESSMENT AND PLAN / ED COURSE  Pertinent labs & imaging results that were available during my care of the patient were reviewed by me and considered in my medical decision making (see chart for  details).   Patient presents emergency department with head/face injury on Coumadin.  No loss of consciousness.  Normal mental status here.  Normal neurologic exam.  Some swelling over the nasal bridge without septal hematoma.  Plan for CT imaging of the head given anticoagulation status and CT face given nasal bridge tenderness.  Patient has INR recheck next week and no signs of abnormal bleeding on exam.  Do not plan on emergent labs here.  Patient has an abrasion to the palm with no wrist or specific scaphoid tenderness.  Normal range of motion of the shoulders, elbows, wrists without bony tenderness.   CT imaging reviewed.  Patient with fracture of the nasal bridge which is not displaced.  Provided contact information for ENT.  Patient to take Tylenol as needed for pain and keep her regularly scheduled INR check.  Discussed ED return precautions. No immediate signs/symptoms of concussion.   ____________________________________________  FINAL CLINICAL IMPRESSION(S) / ED DIAGNOSES  Final diagnoses:  Fall, initial encounter  Injury of head, initial encounter  Closed fracture of nasal bone, initial encounter    MEDICATIONS GIVEN DURING THIS VISIT:  Medications  Tdap (BOOSTRIX) injection 0.5 mL (0.5 mLs Intramuscular Given 03/31/20 1954)    Note:  This document was prepared using Dragon voice recognition software and may include unintentional dictation errors.  Alona Bene, MD, [facility]    Stanley Helmuth, Wonda Olds, MD 03/31/20 2256

## 2020-03-31 NOTE — Discharge Instructions (Signed)
You were seen in the emergency department today with fall and face injury.  You have a small fracture over your nose which will need to heal and have the swelling go down.  I would like for you to follow with your primary care doctor.  I have listed the name of an ear nose and throat doctor for follow up in the next two weeks.  If you develop worsening headache, vomiting, confusion you should return to the emergency department immediately.

## 2020-04-01 ENCOUNTER — Other Ambulatory Visit: Payer: 59

## 2020-04-09 ENCOUNTER — Other Ambulatory Visit: Payer: Self-pay

## 2020-04-09 ENCOUNTER — Ambulatory Visit (INDEPENDENT_AMBULATORY_CARE_PROVIDER_SITE_OTHER): Payer: 59 | Admitting: *Deleted

## 2020-04-09 DIAGNOSIS — D6851 Activated protein C resistance: Secondary | ICD-10-CM

## 2020-04-09 DIAGNOSIS — Z7901 Long term (current) use of anticoagulants: Secondary | ICD-10-CM | POA: Diagnosis not present

## 2020-04-09 LAB — POCT INR: INR: 2.3 (ref 2.0–3.0)

## 2020-04-09 NOTE — Patient Instructions (Signed)
Continue warfarin 2 tablets daily except 1 tablet on Mondays and Thursdays Continue greens Recheck in 6 weeks.  

## 2020-05-12 ENCOUNTER — Ambulatory Visit (INDEPENDENT_AMBULATORY_CARE_PROVIDER_SITE_OTHER): Payer: 59 | Admitting: *Deleted

## 2020-05-12 DIAGNOSIS — D6851 Activated protein C resistance: Secondary | ICD-10-CM | POA: Diagnosis not present

## 2020-05-12 DIAGNOSIS — Z7901 Long term (current) use of anticoagulants: Secondary | ICD-10-CM | POA: Diagnosis not present

## 2020-05-12 LAB — POCT INR: INR: 2.7 (ref 2.0–3.0)

## 2020-05-12 NOTE — Patient Instructions (Signed)
Continue warfarin 2 tablets daily except 1 tablet on Mondays and Thursdays Continue greens Recheck in 6 weeks.

## 2020-06-09 ENCOUNTER — Other Ambulatory Visit: Payer: Self-pay | Admitting: Cardiovascular Disease

## 2020-06-23 ENCOUNTER — Ambulatory Visit (INDEPENDENT_AMBULATORY_CARE_PROVIDER_SITE_OTHER): Payer: 59 | Admitting: *Deleted

## 2020-06-23 DIAGNOSIS — Z7901 Long term (current) use of anticoagulants: Secondary | ICD-10-CM

## 2020-06-23 DIAGNOSIS — D6851 Activated protein C resistance: Secondary | ICD-10-CM

## 2020-06-23 LAB — POCT INR: INR: 1.5 — AB (ref 2.0–3.0)

## 2020-06-23 NOTE — Patient Instructions (Signed)
Take warfarin 2 1/2 tablets today and tomorrow then resume 2 tablets daily except 1 tablet on Mondays and Thursdays Continue greens Recheck in 2 weeks.

## 2020-07-07 ENCOUNTER — Other Ambulatory Visit: Payer: Self-pay

## 2020-07-07 ENCOUNTER — Ambulatory Visit (INDEPENDENT_AMBULATORY_CARE_PROVIDER_SITE_OTHER): Payer: 59 | Admitting: *Deleted

## 2020-07-07 DIAGNOSIS — Z7901 Long term (current) use of anticoagulants: Secondary | ICD-10-CM | POA: Diagnosis not present

## 2020-07-07 DIAGNOSIS — D6851 Activated protein C resistance: Secondary | ICD-10-CM

## 2020-07-07 LAB — POCT INR: INR: 3.6 — AB (ref 2.0–3.0)

## 2020-07-07 NOTE — Patient Instructions (Signed)
Hold warfarin today then resume 2 tablets daily except 1 tablet on Mondays and Thursdays Continue greens Recheck in 4 weeks.

## 2020-07-30 ENCOUNTER — Other Ambulatory Visit: Payer: Self-pay

## 2020-07-30 ENCOUNTER — Other Ambulatory Visit: Payer: 59

## 2020-07-30 DIAGNOSIS — Z20822 Contact with and (suspected) exposure to covid-19: Secondary | ICD-10-CM

## 2020-07-31 ENCOUNTER — Ambulatory Visit (INDEPENDENT_AMBULATORY_CARE_PROVIDER_SITE_OTHER): Payer: 59 | Admitting: *Deleted

## 2020-07-31 ENCOUNTER — Other Ambulatory Visit: Payer: Self-pay

## 2020-07-31 DIAGNOSIS — Z7901 Long term (current) use of anticoagulants: Secondary | ICD-10-CM

## 2020-07-31 DIAGNOSIS — D6851 Activated protein C resistance: Secondary | ICD-10-CM

## 2020-07-31 LAB — POCT INR: INR: 3.2 — AB (ref 2.0–3.0)

## 2020-07-31 NOTE — Patient Instructions (Signed)
Decrease warfarin to 2 tablets daily except 1 tablet on Sundays, Tuesdays and Thursdays Continue greens Recheck in 4 weeks.

## 2020-08-01 LAB — SARS-COV-2, NAA 2 DAY TAT

## 2020-08-01 LAB — NOVEL CORONAVIRUS, NAA: SARS-CoV-2, NAA: NOT DETECTED

## 2020-08-28 ENCOUNTER — Other Ambulatory Visit: Payer: Self-pay

## 2020-08-28 ENCOUNTER — Ambulatory Visit (INDEPENDENT_AMBULATORY_CARE_PROVIDER_SITE_OTHER): Payer: 59 | Admitting: *Deleted

## 2020-08-28 DIAGNOSIS — D6851 Activated protein C resistance: Secondary | ICD-10-CM

## 2020-08-28 DIAGNOSIS — Z7901 Long term (current) use of anticoagulants: Secondary | ICD-10-CM

## 2020-08-28 LAB — POCT INR: INR: 2.5 (ref 2.0–3.0)

## 2020-08-28 NOTE — Patient Instructions (Signed)
Continue warfarin 2 tablets daily except 1 tablet on Sundays, Tuesdays and Thursdays Continue greens Recheck in 6 weeks.  

## 2020-10-06 ENCOUNTER — Ambulatory Visit (INDEPENDENT_AMBULATORY_CARE_PROVIDER_SITE_OTHER): Payer: 59 | Admitting: *Deleted

## 2020-10-06 DIAGNOSIS — Z7901 Long term (current) use of anticoagulants: Secondary | ICD-10-CM | POA: Diagnosis not present

## 2020-10-06 DIAGNOSIS — D6851 Activated protein C resistance: Secondary | ICD-10-CM

## 2020-10-06 LAB — POCT INR: INR: 1.9 — AB (ref 2.0–3.0)

## 2020-10-06 NOTE — Patient Instructions (Signed)
Take warfarin 2 tablets tonight and tomorrow night then resume 2 tablets daily except 1 tablet on Sundays, Tuesdays and Thursdays Continue greens Recheck in 6 weeks.

## 2020-11-25 ENCOUNTER — Ambulatory Visit (INDEPENDENT_AMBULATORY_CARE_PROVIDER_SITE_OTHER): Payer: 59 | Admitting: *Deleted

## 2020-11-25 ENCOUNTER — Other Ambulatory Visit: Payer: Self-pay

## 2020-11-25 DIAGNOSIS — Z7901 Long term (current) use of anticoagulants: Secondary | ICD-10-CM | POA: Diagnosis not present

## 2020-11-25 LAB — POCT INR: INR: 2.3 (ref 2.0–3.0)

## 2020-11-25 NOTE — Patient Instructions (Signed)
Continue warfarin 2 tablets daily except 1 tablet on Sundays, Tuesdays and Thursdays Continue greens Recheck in 6 weeks.

## 2020-12-29 ENCOUNTER — Ambulatory Visit (INDEPENDENT_AMBULATORY_CARE_PROVIDER_SITE_OTHER): Payer: 59 | Admitting: *Deleted

## 2020-12-29 DIAGNOSIS — D6851 Activated protein C resistance: Secondary | ICD-10-CM

## 2020-12-29 DIAGNOSIS — Z7901 Long term (current) use of anticoagulants: Secondary | ICD-10-CM

## 2020-12-29 LAB — POCT INR: INR: 2.3 (ref 2.0–3.0)

## 2020-12-29 NOTE — Patient Instructions (Signed)
Continue warfarin 2 tablets daily except 1 tablet on Sundays, Tuesdays and Thursdays Continue greens Recheck in 6 weeks.  

## 2021-02-23 ENCOUNTER — Ambulatory Visit (INDEPENDENT_AMBULATORY_CARE_PROVIDER_SITE_OTHER): Payer: 59 | Admitting: *Deleted

## 2021-02-23 DIAGNOSIS — Z7901 Long term (current) use of anticoagulants: Secondary | ICD-10-CM | POA: Diagnosis not present

## 2021-02-23 DIAGNOSIS — D6851 Activated protein C resistance: Secondary | ICD-10-CM

## 2021-02-23 LAB — POCT INR: INR: 1.7 — AB (ref 2.0–3.0)

## 2021-02-23 NOTE — Patient Instructions (Signed)
Take warfarin 3 tablets today and 2 tablets tomorrow then resume 2 tablets daily except 1 tablet on Sundays, Tuesdays and Thursdays Continue greens Recheck in 3 weeks.

## 2021-03-18 ENCOUNTER — Ambulatory Visit (INDEPENDENT_AMBULATORY_CARE_PROVIDER_SITE_OTHER): Payer: 59 | Admitting: *Deleted

## 2021-03-18 DIAGNOSIS — Z7901 Long term (current) use of anticoagulants: Secondary | ICD-10-CM

## 2021-03-18 DIAGNOSIS — D6851 Activated protein C resistance: Secondary | ICD-10-CM

## 2021-03-18 LAB — POCT INR: INR: 1.9 — AB (ref 2.0–3.0)

## 2021-03-18 NOTE — Patient Instructions (Signed)
Increase warfarin to 2 tablets daily except 1 tablet on Sundays and Thursdays Continue greens Recheck in 4 weeks.

## 2021-04-16 ENCOUNTER — Ambulatory Visit (INDEPENDENT_AMBULATORY_CARE_PROVIDER_SITE_OTHER): Payer: 59 | Admitting: *Deleted

## 2021-04-16 DIAGNOSIS — Z7901 Long term (current) use of anticoagulants: Secondary | ICD-10-CM

## 2021-04-16 DIAGNOSIS — D6851 Activated protein C resistance: Secondary | ICD-10-CM

## 2021-04-16 LAB — POCT INR: INR: 1.9 — AB (ref 2.0–3.0)

## 2021-04-16 NOTE — Patient Instructions (Signed)
Has been taking 2 tablets daily except 1 tablet on Thursday Increase warfarin to 2 tablets daily  Continue greens Recheck in 4 weeks.

## 2021-04-27 ENCOUNTER — Other Ambulatory Visit: Payer: Self-pay | Admitting: Cardiovascular Disease

## 2021-05-06 IMAGING — CT CT HEAD W/O CM
3 series · 16 of 47 positions shown, 19 images · non-contrast
Comparison: None.

CLINICAL DATA: Head injury, lost footing with injury to bridge of
nose and upper lip. No loss of consciousness.

EXAM:
CT HEAD WITHOUT CONTRAST
TECHNIQUE: Contiguous axial images were obtained from the base of the skull
through the vertex without intravenous contrast.

[Series 2: head w o · axial · 0.41mm/px · z∈[-18,+112]mm · 10 of 32 slices shown, 13 images]
[im 3/32  brain]
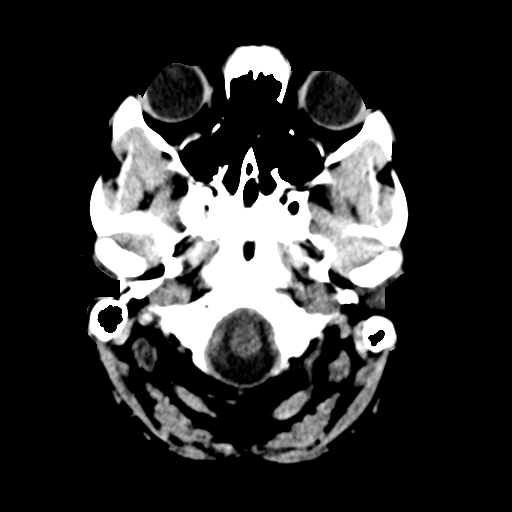
[im 3/32  bone]
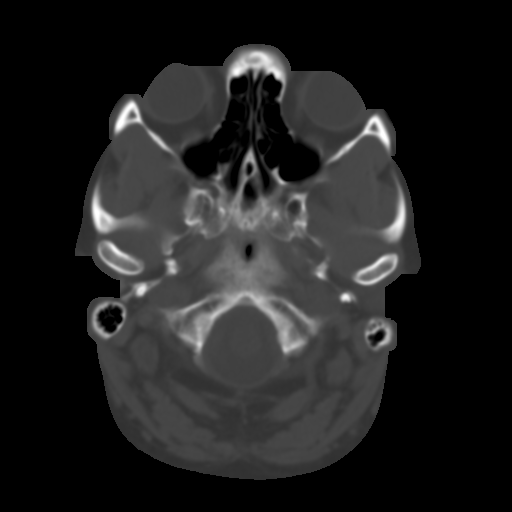
[im 6/32  brain]
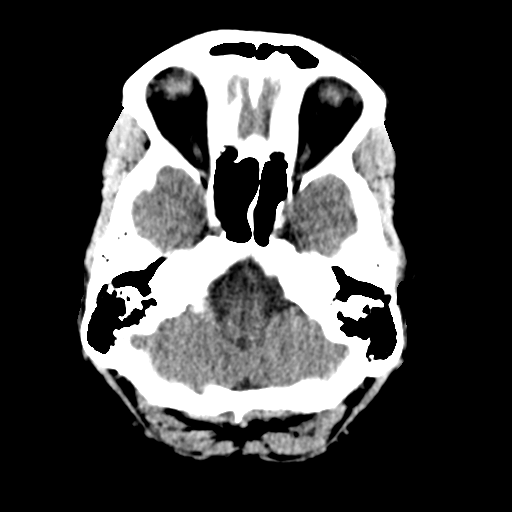
[im 9/32  brain]
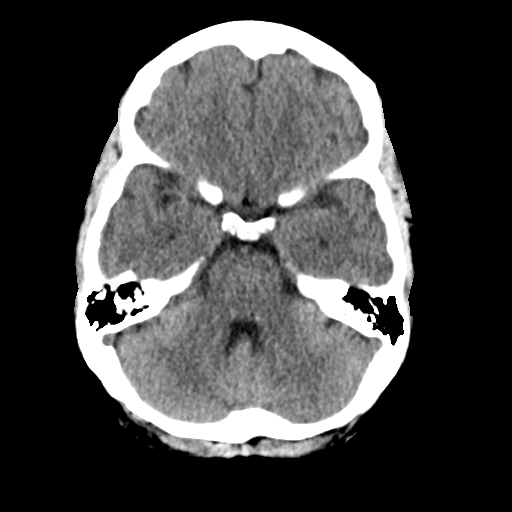
[im 11/32  brain]
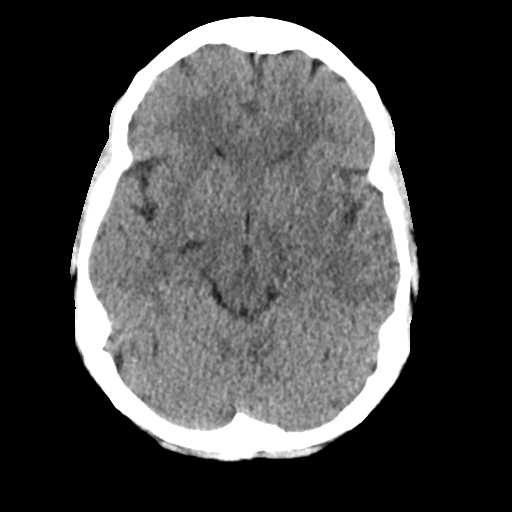
[im 14/32  brain]
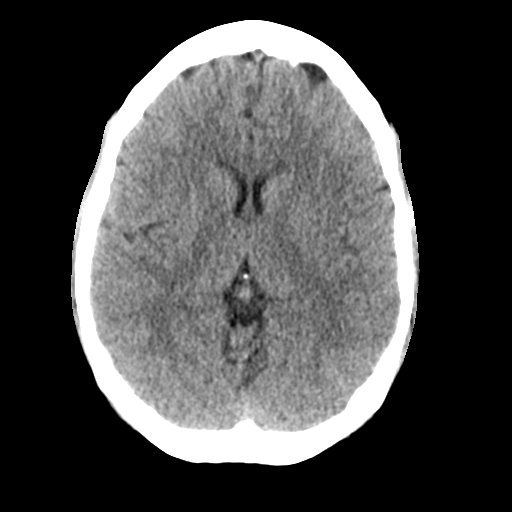
[im 14/32  bone]
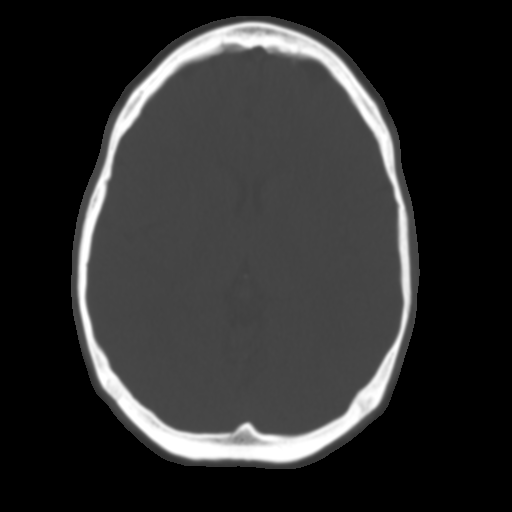
[im 18/32  brain]
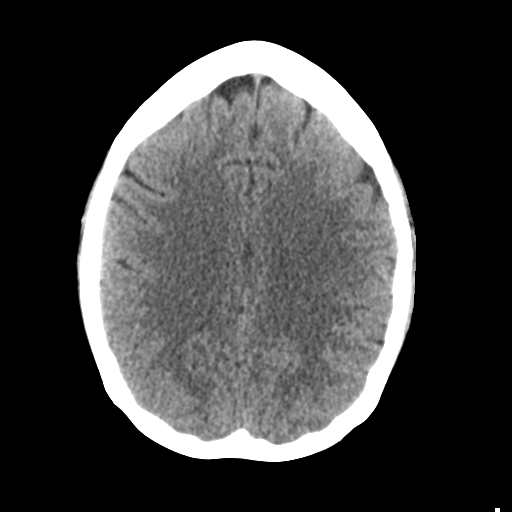
[im 21/32  brain]
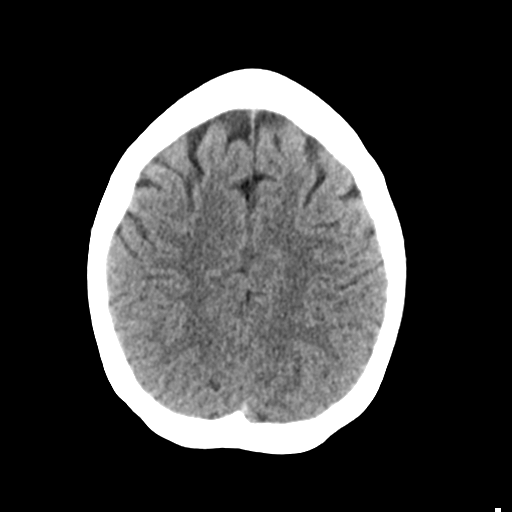
[im 24/32  brain]
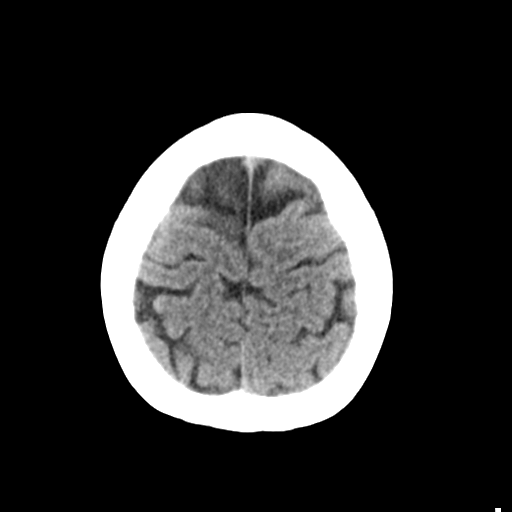
[im 26/32  brain]
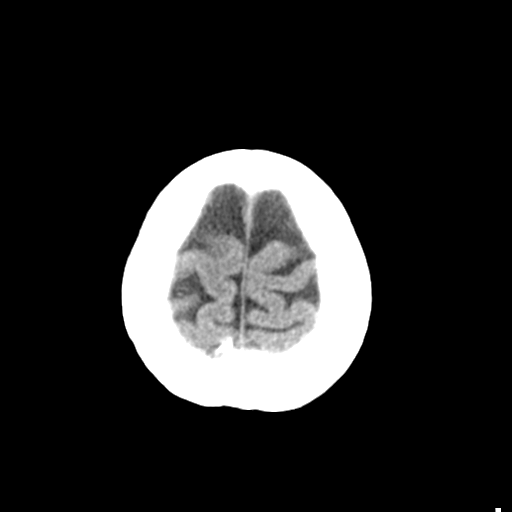
[im 26/32  bone]
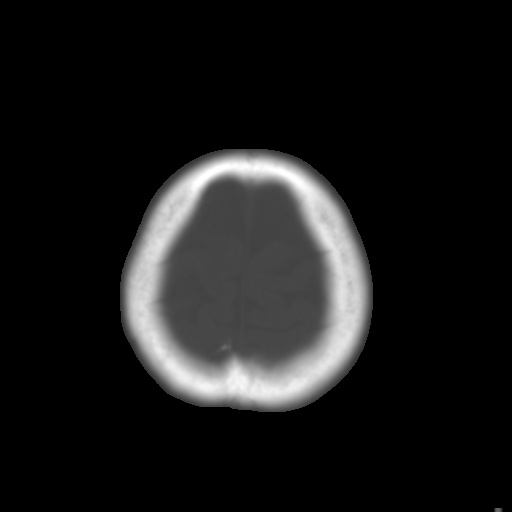
[im 29/32  brain]
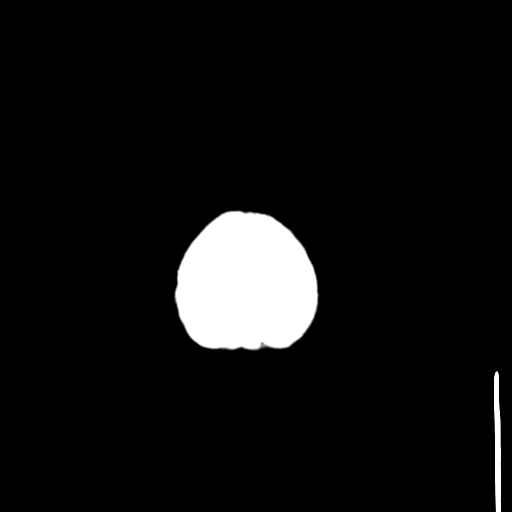

[Series 4: coronal soft · coronal · 0.33mm/px · 3 of 65 slices shown]
[im 22/65  brain]
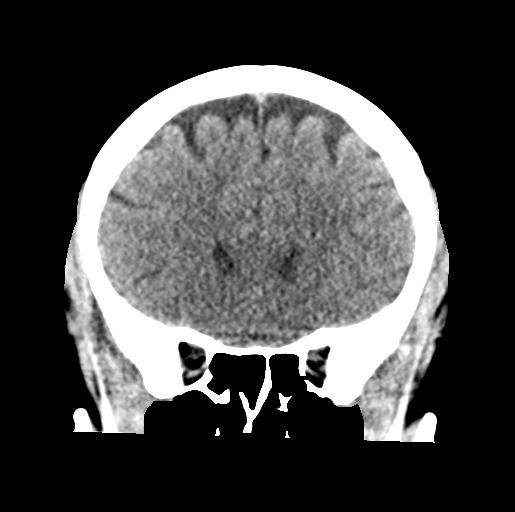
[im 29/65  brain]
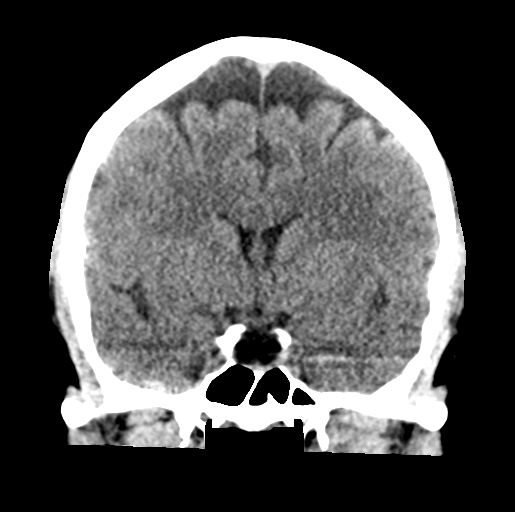
[im 36/65  brain]
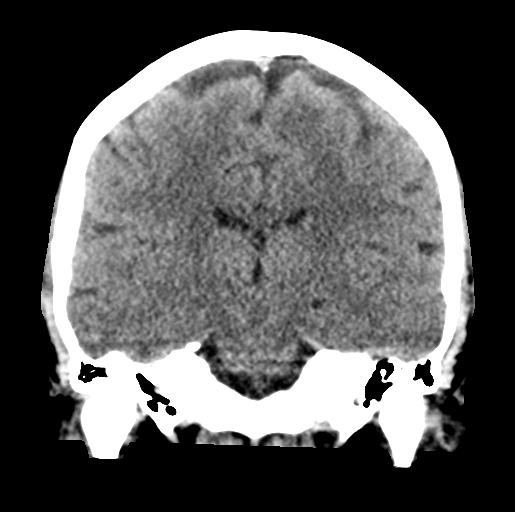

[Series 5: sagittal soft · sagittal · 0.31mm/px · 3 of 56 slices shown]
[im 19/56  brain]
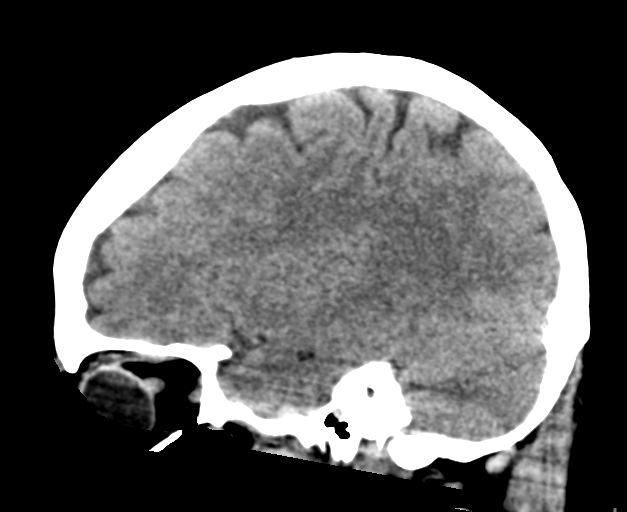
[im 28/56  brain]
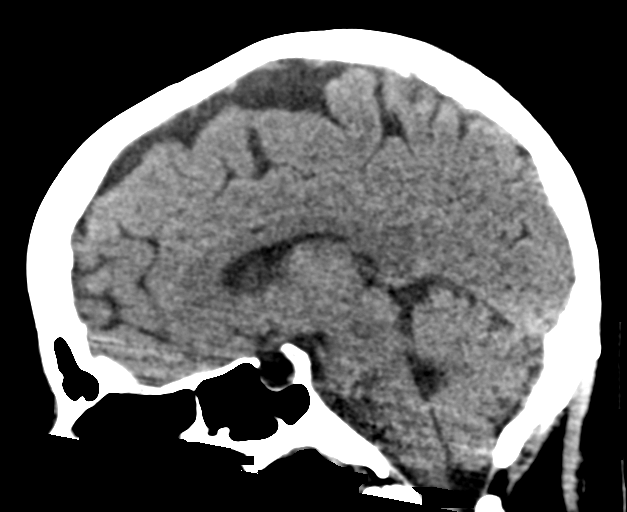
[im 37/56  brain]
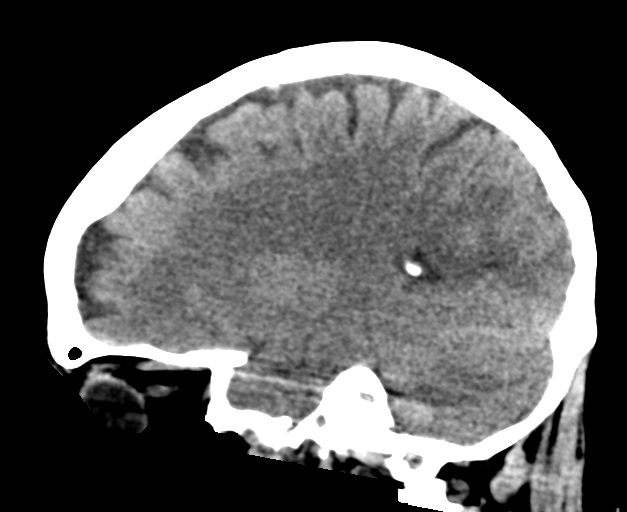

[16 of 47 positions shown; findings below may reference images not displayed]

FINDINGS: Brain: No intracranial hemorrhage, mass effect, or midline shift. No
hydrocephalus. The basilar cisterns are patent. No evidence of
territorial infarct or acute ischemia. No extra-axial or
intracranial fluid collection.

Vascular: No hyperdense vessel or unexpected calcification.

Skull: No fracture or focal lesion.

Sinuses/Orbits: Assessed on concurrent face CT, performed
concurrently and reported separately.

Other: None.
IMPRESSION: Negative head CT.

## 2021-05-06 IMAGING — CT CT MAXILLOFACIAL W/O CM
3 of 5 series · 16 of 47 positions shown, 19 images · non-contrast
Comparison: None.

CLINICAL DATA: Lost footing leading to fall. Injury to bridge of
nose and upper lip.

EXAM:
CT MAXILLOFACIAL WITHOUT CONTRAST
TECHNIQUE: Multidetector CT imaging of the maxillofacial structures was
performed. Multiplanar CT image reconstructions were also generated.

[Series 2: max soft · axial · 0.37mm/px · z∈[-126,+12]mm · 12 of 81 slices shown, 15 images]
[im 6/81  brain]
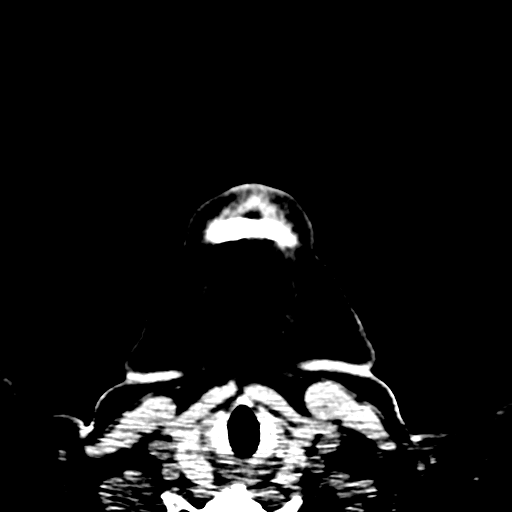
[im 6/81  bone]
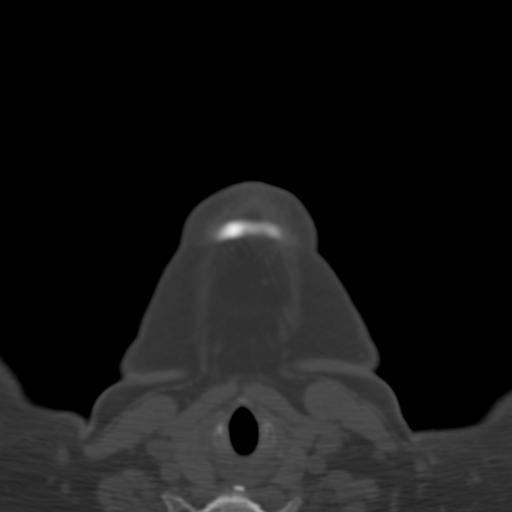
[im 12/81  bone]
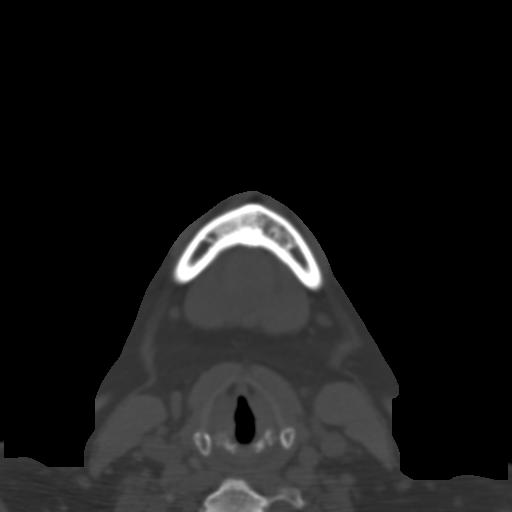
[im 17/81  bone]
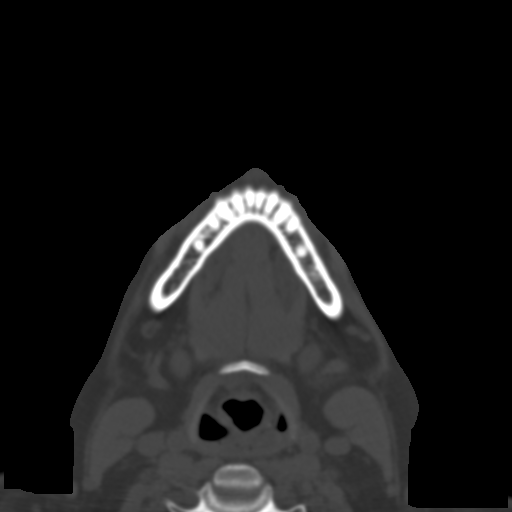
[im 25/81  bone]
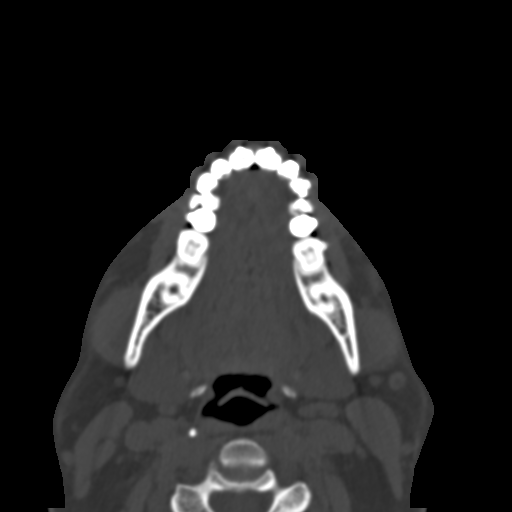
[im 31/81  brain]
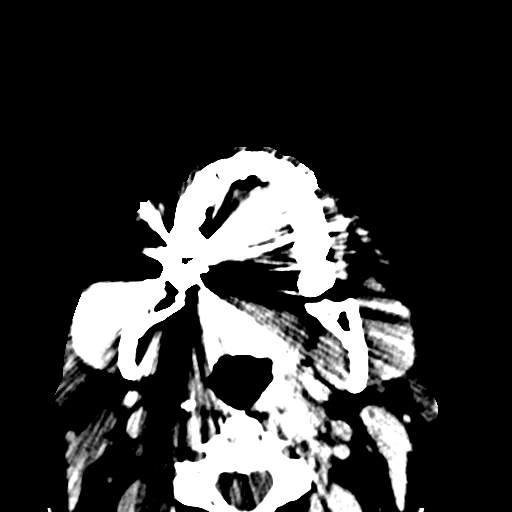
[im 31/81  bone]
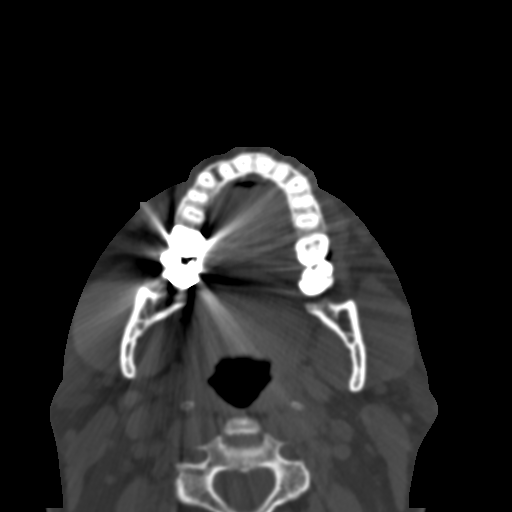
[im 36/81  bone]
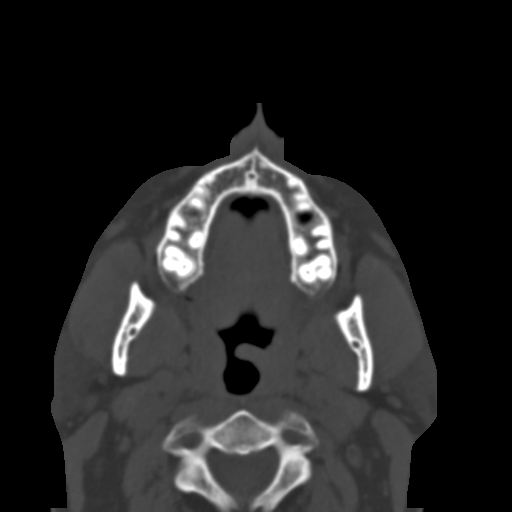
[im 45/81  bone]
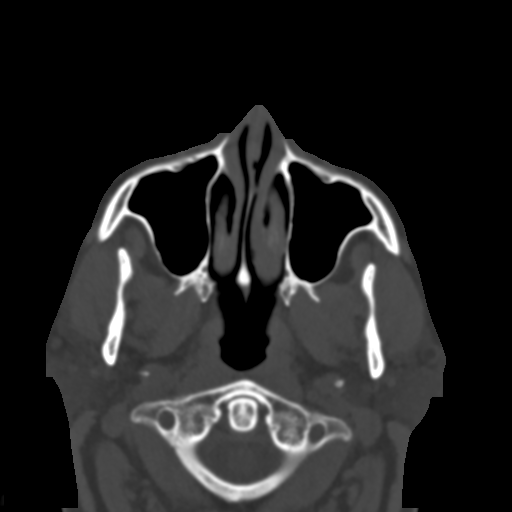
[im 50/81  bone]
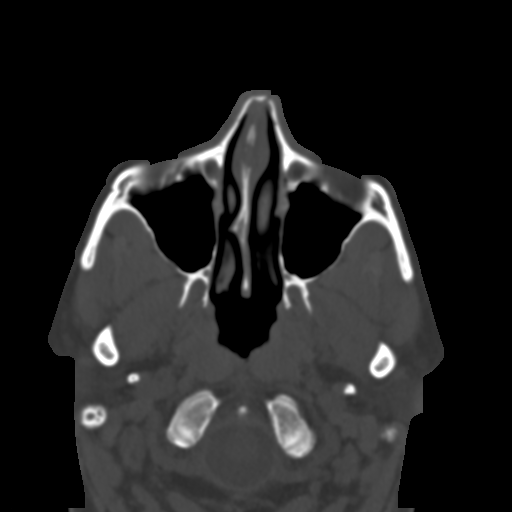
[im 56/81  brain]
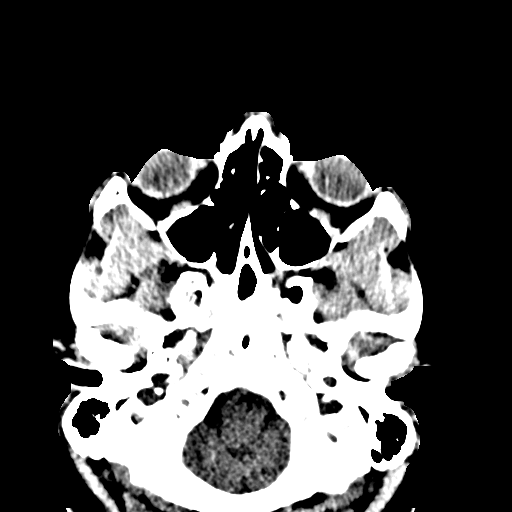
[im 56/81  bone]
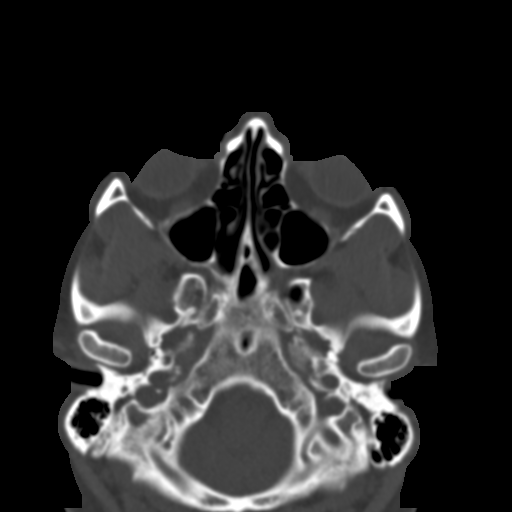
[im 64/81  bone]
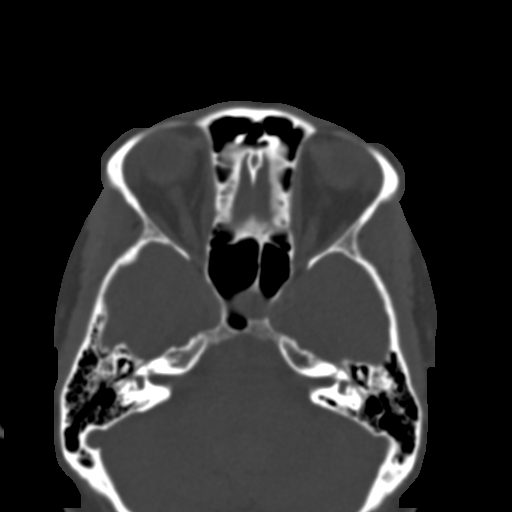
[im 69/81  bone]
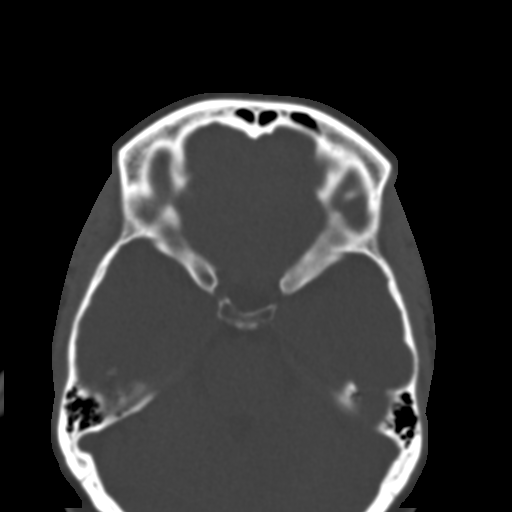
[im 75/81  bone]
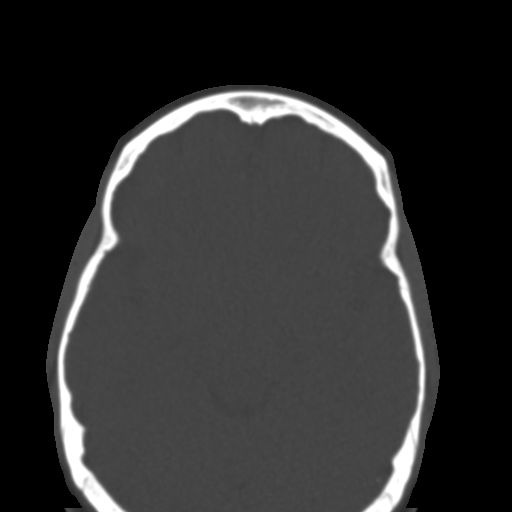

[Series 8: coronal bone · coronal · 0.33mm/px · 3 of 78 slices shown]
[im 20/78  bone]
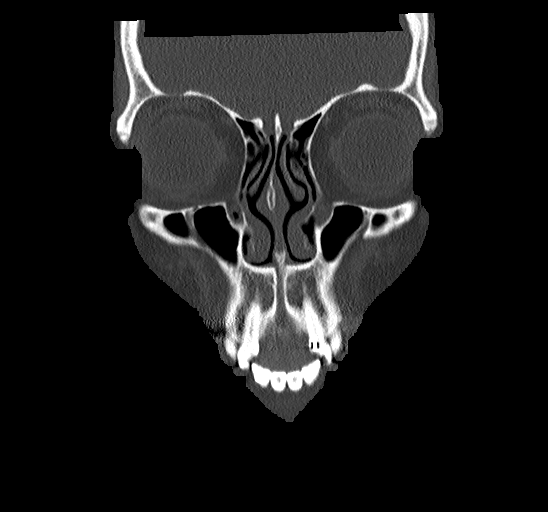
[im 39/78  bone]
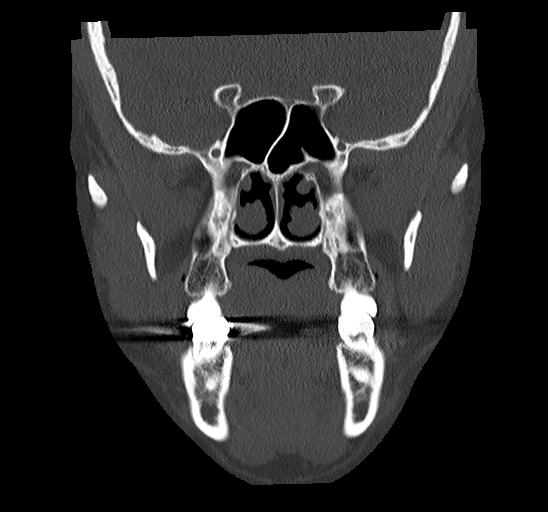
[im 58/78  bone]
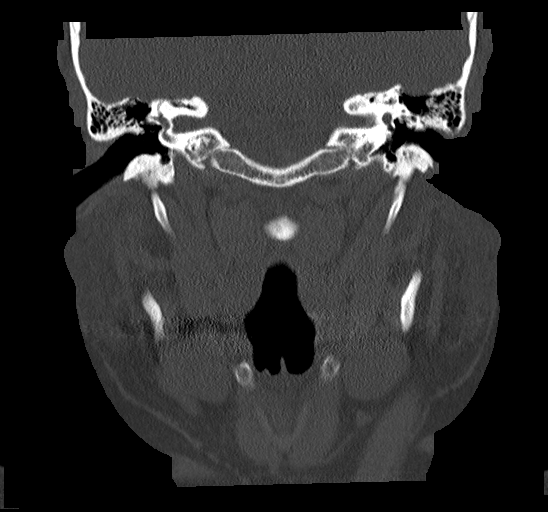

[Series 9: sagittal bone · sagittal · 0.34mm/px · 1 of 86 slices shown]
[im 43/86  bone]
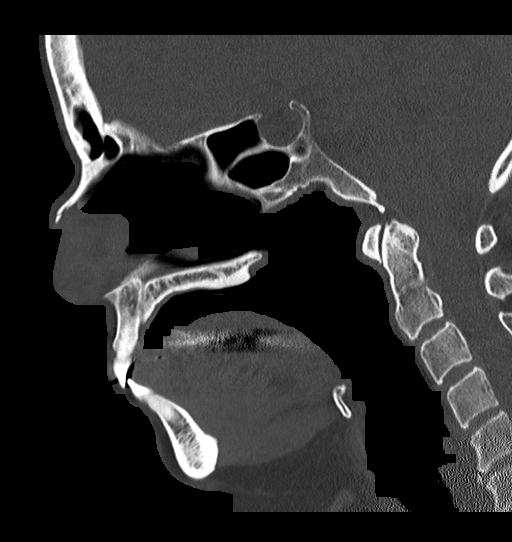

[16 of 47 positions shown; findings below may reference images not displayed]

FINDINGS: Osseous: Nondisplaced nasal bridge fracture. Mega medic arches,
mandibles, and pterygoid plates are intact. The temporomandibular
joints are congruent. Nasal septum is midline.

Orbits: No orbital fracture. Both orbits and globes are intact.

Sinuses: No sinus fracture or fluid level. No significant mucosal
thickening. Mastoid air cells are clear.

Soft tissues: Negative.

Limited intracranial: Assessed on concurrent head CT, reported
separately.
IMPRESSION: Nondisplaced nasal bridge fracture.

## 2021-06-03 ENCOUNTER — Ambulatory Visit (INDEPENDENT_AMBULATORY_CARE_PROVIDER_SITE_OTHER): Payer: 59 | Admitting: *Deleted

## 2021-06-03 DIAGNOSIS — D6851 Activated protein C resistance: Secondary | ICD-10-CM

## 2021-06-03 DIAGNOSIS — Z7901 Long term (current) use of anticoagulants: Secondary | ICD-10-CM

## 2021-06-03 LAB — POCT INR: INR: 4.2 — AB (ref 2.0–3.0)

## 2021-06-03 NOTE — Patient Instructions (Signed)
Hold warfarin tonight then decrease dose to 2 tablets daily except 1 tablet on Thursdays Continue greens Recheck in 3 weeks.

## 2021-06-25 ENCOUNTER — Ambulatory Visit (INDEPENDENT_AMBULATORY_CARE_PROVIDER_SITE_OTHER): Payer: 59 | Admitting: *Deleted

## 2021-06-25 DIAGNOSIS — Z7901 Long term (current) use of anticoagulants: Secondary | ICD-10-CM | POA: Diagnosis not present

## 2021-06-25 DIAGNOSIS — D6851 Activated protein C resistance: Secondary | ICD-10-CM | POA: Diagnosis not present

## 2021-06-25 LAB — POCT INR: INR: 3.4 — AB (ref 2.0–3.0)

## 2021-06-25 NOTE — Patient Instructions (Signed)
Hold warfarin tonight then decrease dose to 2 tablets daily except 1 1/2 tablets on Mondays, Wednesdays and Fridays Continue greens Recheck in 4 weeks.

## 2021-07-30 ENCOUNTER — Other Ambulatory Visit: Payer: Self-pay | Admitting: Cardiovascular Disease

## 2021-10-05 ENCOUNTER — Telehealth: Payer: Self-pay | Admitting: *Deleted

## 2021-10-05 NOTE — Telephone Encounter (Signed)
Pt overdue to have INR checked. Pt is also overdue to see the cardiologist. Per records it looks like pt has not seen a cardiologist or APP since 2018. Called pt and LMOM for her to call the clinic back to make an appointment.

## 2021-11-30 ENCOUNTER — Other Ambulatory Visit: Payer: Self-pay | Admitting: *Deleted

## 2021-11-30 MED ORDER — WARFARIN SODIUM 5 MG PO TABS: ORAL_TABLET | ORAL | 0 refills | Status: DC

## 2021-11-30 NOTE — Telephone Encounter (Signed)
Pt is out of warfarin and is PAST DUE for MD appt and INR check.  Has been out of warfarin x 1 wk.  Appt made for INR check and MD appt.  Refill approved x 1.

## 2021-12-07 ENCOUNTER — Other Ambulatory Visit: Payer: Self-pay

## 2021-12-07 ENCOUNTER — Ambulatory Visit (INDEPENDENT_AMBULATORY_CARE_PROVIDER_SITE_OTHER): Payer: 59 | Admitting: *Deleted

## 2021-12-07 DIAGNOSIS — D6851 Activated protein C resistance: Secondary | ICD-10-CM | POA: Diagnosis not present

## 2021-12-07 DIAGNOSIS — Z7901 Long term (current) use of anticoagulants: Secondary | ICD-10-CM | POA: Diagnosis not present

## 2021-12-07 LAB — POCT INR: INR: 2.1 (ref 2.0–3.0)

## 2021-12-07 NOTE — Patient Instructions (Signed)
Continue warfarin 2 tablets daily except 1 1/2 tablets on Mondays, Wednesdays and Fridays Continue greens Recheck in 6 weeks.  

## 2022-01-07 ENCOUNTER — Other Ambulatory Visit: Payer: Self-pay | Admitting: Cardiovascular Disease

## 2022-01-18 ENCOUNTER — Ambulatory Visit: Payer: 59 | Admitting: Cardiology

## 2022-01-19 ENCOUNTER — Ambulatory Visit (INDEPENDENT_AMBULATORY_CARE_PROVIDER_SITE_OTHER): Payer: 59 | Admitting: *Deleted

## 2022-01-19 DIAGNOSIS — D6851 Activated protein C resistance: Secondary | ICD-10-CM

## 2022-01-19 DIAGNOSIS — Z7901 Long term (current) use of anticoagulants: Secondary | ICD-10-CM | POA: Diagnosis not present

## 2022-01-19 LAB — POCT INR: INR: 2.8 (ref 2.0–3.0)

## 2022-01-19 NOTE — Patient Instructions (Signed)
Continue warfarin 2 tablets daily except 1 1/2 tablets on Mondays, Wednesdays and Fridays Continue greens Recheck in 6 weeks.  

## 2022-01-30 ENCOUNTER — Other Ambulatory Visit: Payer: Self-pay | Admitting: Cardiovascular Disease

## 2022-02-23 ENCOUNTER — Encounter: Payer: Self-pay | Admitting: *Deleted

## 2022-02-26 ENCOUNTER — Ambulatory Visit: Payer: 59 | Admitting: Cardiology

## 2022-03-23 ENCOUNTER — Other Ambulatory Visit: Payer: Self-pay | Admitting: Cardiovascular Disease

## 2022-03-23 DIAGNOSIS — Z7901 Long term (current) use of anticoagulants: Secondary | ICD-10-CM

## 2022-03-23 MED ORDER — WARFARIN SODIUM 5 MG PO TABS: ORAL_TABLET | ORAL | 0 refills | Status: DC

## 2022-03-23 NOTE — Telephone Encounter (Signed)
Pt made an appt for coumadin on May 26th at 8 AM. ?She is out of medication.  ?

## 2022-03-23 NOTE — Telephone Encounter (Signed)
Prescription refill request received for warfarin ?Lov: OVERDUE - 10/28/17  ?Next INR check: OVERDUE - 04/09/22 ?Warfarin tablet strength: 5mg  ? ?Pt overdue for anticoagulation visit and cardiology visit. Message sent to schedulers to schedule appt with cardiology. Small refill sent to pharmacy to last until anticoagulation appt on 04/09/22  ?

## 2022-04-08 ENCOUNTER — Ambulatory Visit (INDEPENDENT_AMBULATORY_CARE_PROVIDER_SITE_OTHER): Payer: 59 | Admitting: *Deleted

## 2022-04-08 DIAGNOSIS — Z5181 Encounter for therapeutic drug level monitoring: Secondary | ICD-10-CM

## 2022-04-08 DIAGNOSIS — Z7901 Long term (current) use of anticoagulants: Secondary | ICD-10-CM

## 2022-04-08 LAB — POCT INR: INR: 3 (ref 2.0–3.0)

## 2022-04-08 MED ORDER — WARFARIN SODIUM 5 MG PO TABS: ORAL_TABLET | ORAL | 0 refills | Status: DC

## 2022-04-08 NOTE — Patient Instructions (Signed)
Description   Continue warfarin 2 tablets daily except 1 1/2 tablets on Mondays, Wednesdays and Fridays Continue greens Recheck in 6 weeks.

## 2022-05-10 ENCOUNTER — Other Ambulatory Visit: Payer: Self-pay | Admitting: Cardiology

## 2022-05-10 DIAGNOSIS — Z7901 Long term (current) use of anticoagulants: Secondary | ICD-10-CM

## 2022-05-20 ENCOUNTER — Ambulatory Visit (INDEPENDENT_AMBULATORY_CARE_PROVIDER_SITE_OTHER): Payer: 59 | Admitting: *Deleted

## 2022-05-20 DIAGNOSIS — D6851 Activated protein C resistance: Secondary | ICD-10-CM | POA: Diagnosis not present

## 2022-05-20 DIAGNOSIS — Z7901 Long term (current) use of anticoagulants: Secondary | ICD-10-CM

## 2022-05-20 LAB — POCT INR: INR: 2.5 (ref 2.0–3.0)

## 2022-05-20 NOTE — Patient Instructions (Signed)
Continue warfarin 2 tablets daily except 1 1/2 tablets on Mondays, Wednesdays and Fridays Continue greens Recheck in 6 weeks.

## 2022-06-08 ENCOUNTER — Other Ambulatory Visit: Payer: Self-pay | Admitting: Cardiology

## 2022-06-08 DIAGNOSIS — Z7901 Long term (current) use of anticoagulants: Secondary | ICD-10-CM

## 2022-06-08 NOTE — Telephone Encounter (Signed)
Received refill request for warfarin:  Last INR was 2.5 on 05/20/22 Next INR is due on 06/28/22 Next OV due 06/28/22  Dominga Ferry MD  Refill approved

## 2022-06-28 ENCOUNTER — Ambulatory Visit (INDEPENDENT_AMBULATORY_CARE_PROVIDER_SITE_OTHER): Payer: 59 | Admitting: Cardiology

## 2022-06-28 ENCOUNTER — Encounter: Payer: Self-pay | Admitting: Cardiology

## 2022-06-28 ENCOUNTER — Ambulatory Visit (INDEPENDENT_AMBULATORY_CARE_PROVIDER_SITE_OTHER): Payer: 59 | Admitting: *Deleted

## 2022-06-28 VITALS — BP 124/88 | HR 79 | Ht 68.0 in | Wt 252.0 lb

## 2022-06-28 DIAGNOSIS — I10 Essential (primary) hypertension: Secondary | ICD-10-CM

## 2022-06-28 DIAGNOSIS — Z7901 Long term (current) use of anticoagulants: Secondary | ICD-10-CM

## 2022-06-28 DIAGNOSIS — D6851 Activated protein C resistance: Secondary | ICD-10-CM | POA: Diagnosis not present

## 2022-06-28 DIAGNOSIS — Z136 Encounter for screening for cardiovascular disorders: Secondary | ICD-10-CM | POA: Diagnosis not present

## 2022-06-28 LAB — POCT INR: INR: 5 — AB (ref 2.0–3.0)

## 2022-06-28 NOTE — Progress Notes (Signed)
Clinical Summary [name] is a 46 y.o.female seen today as a new patient, last seen in 2019 by PA Barrett. This is our first visit together.   HTN -compliant with meds - home bp's usally 120s/80s   2. History of DVT/Factor V Leiden deficiecency - on coumadin - heavy menstrual bleeding on xarelto, tolerates coumadin better - no bleeding on coumadin.   3. Palpitations - 2 episodes over the last month. Would feel at night. Mild heaviness in chest. Lasts just a few seconds - coffee x 2 cups, rare sodas, regular tea, no energy drinks     SH: has 2 kids, ages 29 and 6. Pleas Koch to 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     buPROPion (WELLBUTRIN XL) 300 MG 24 hr tablet Take 300 mg by mouth every morning.     cholecalciferol (VITAMIN D3) 25 MCG (1000 UNIT) tablet Take 1,000 Units by mouth daily.     loratadine (CLARITIN) 10 MG tablet Take 10 mg by mouth daily.     metFORMIN (GLUCOPHAGE) 500 MG tablet Take 500 mg by mouth daily.      metoprolol succinate (TOPROL-XL) 50 MG 24 hr tablet Take 50 mg by mouth daily. Take with or immediately following a meal.     spironolactone-hydrochlorothiazide (ALDACTAZIDE) 25-25 MG tablet Take 1 tablet by mouth in the morning.     warfarin (COUMADIN) 5 MG tablet WARFARIN 1 1/2 TO 2 TABLETS DAILY AS DIRECTED BY COUMADIN CLINIC 60 tablet 1   No current facility-administered medications for this visit.     Past Surgical History:  Procedure Laterality Date   CESAREAN SECTION  2010   DOPPER R LOWER  EXTR ([facility] HX)  04/18/2009   thrombus right popliteal vein     No Known Allergies    Family History  Problem Relation Age of Onset   Hypertension Mother    Diabetes Father    Factor V Leiden deficiency Father      Social History Gabriella Carey reports that she has never smoked. She has never used smokeless tobacco. Gabriella Carey reports current alcohol use of about 3.0 standard drinks of alcohol per week.   Review of Systems CONSTITUTIONAL: No weight loss, fever, chills, weakness or fatigue.  HEENT: Eyes: No visual loss, blurred vision, double vision or yellow sclerae.No hearing loss, sneezing, congestion, runny nose or sore throat.  SKIN: No rash or itching.  CARDIOVASCULAR: per hpi RESPIRATORY: No shortness of breath, cough or sputum.  GASTROINTESTINAL: No anorexia, nausea, vomiting or diarrhea. No abdominal pain or blood.  GENITOURINARY: No burning on urination, no polyuria NEUROLOGICAL: No headache, dizziness, syncope, paralysis, ataxia, numbness or tingling in the extremities. No change in bowel or bladder control.  MUSCULOSKELETAL: No muscle, back pain, joint pain or stiffness.  LYMPHATICS: No enlarged nodes. No history of splenectomy.  PSYCHIATRIC: No history of depression or anxiety.  ENDOCRINOLOGIC: No reports of sweating, cold or heat intolerance. No polyuria or polydipsia.  Marland Kitchen   Physical Examination Today's Vitals  06/28/22 1500  BP: 124/88  Pulse: 79  SpO2: 98%  Weight: 252 lb (114.3 kg)  Height: 5\' 8"  (1.727 m)   Body mass index is 38.32 kg/m.  Gen: resting comfortably, no acute distress HEENT: no scleral icterus, pupils equal round and reactive, no palptable cervical adenopathy,  CV: RRR, no m/r gno jvd Resp: Clear to auscultation bilaterally GI: abdomen is soft, non-tender, non-distended, normal bowel sounds, no hepatosplenomegaly MSK: extremities are warm, no edema.  Skin: warm, no rash Neuro:  no focal deficits Psych: appropriate  affect     Assessment and Plan  1.HTN - elevated today, she will call with updated home bp's  2. History of DVT/Factor V leiden - continue coumadin  3. Palpitations - mild, infrequent - would wean caffeine and monitor at this time. If progression would consider home monitor   F/u 1 year. Request labs from pcp EKG shows NSR      [place], M.D.

## 2022-06-28 NOTE — Patient Instructions (Addendum)
Medication Instructions:  Continue all other medications.     Labwork: none  Testing/Procedures: none  Follow-Up: Your physician wants you to follow up in:  1 year.  You should receive a call from the office when due.  If you don't receive this call, please call our office to schedule the follow up appointment    Any Other Special Instructions Will Be Listed Below (If Applicable). Please call the office or send mychart message on Friday with update on your blood pressure readings.   If you need a refill on your cardiac medications before your next appointment, please call your pharmacy.

## 2022-07-01 ENCOUNTER — Encounter: Payer: Self-pay | Admitting: *Deleted

## 2022-07-07 ENCOUNTER — Other Ambulatory Visit: Payer: Self-pay | Admitting: Cardiology

## 2022-07-07 DIAGNOSIS — Z7901 Long term (current) use of anticoagulants: Secondary | ICD-10-CM

## 2022-07-07 NOTE — Telephone Encounter (Signed)
Refill request for warfarin:  Last INR was 5.0 on 06/28/22 Next INR due on 07/13/22 LOV was 06/28/22  J Branch  Refill approved.

## 2022-07-13 ENCOUNTER — Ambulatory Visit: Payer: 59 | Attending: Cardiovascular Disease | Admitting: *Deleted

## 2022-07-13 DIAGNOSIS — Z7901 Long term (current) use of anticoagulants: Secondary | ICD-10-CM

## 2022-07-13 DIAGNOSIS — D6851 Activated protein C resistance: Secondary | ICD-10-CM | POA: Diagnosis not present

## 2022-07-13 LAB — POCT INR: INR: 3.8 — AB (ref 2.0–3.0)

## 2022-07-13 NOTE — Patient Instructions (Signed)
Hold warfarin tonight then decrease dose to 1 1/2 tablets daily except 2 tablets on Sundays and Thursdays Continue greens Recheck in 4 weeks.

## 2022-08-04 ENCOUNTER — Encounter: Payer: Self-pay | Admitting: *Deleted

## 2022-08-17 ENCOUNTER — Ambulatory Visit: Payer: 59 | Attending: Cardiovascular Disease | Admitting: *Deleted

## 2022-08-17 DIAGNOSIS — Z7901 Long term (current) use of anticoagulants: Secondary | ICD-10-CM

## 2022-08-17 DIAGNOSIS — D6851 Activated protein C resistance: Secondary | ICD-10-CM | POA: Diagnosis not present

## 2022-08-17 LAB — POCT INR: INR: 3.3 — AB (ref 2.0–3.0)

## 2022-08-17 NOTE — Patient Instructions (Signed)
Take warfarin 1/2 tablet today then decrease dose to 1 1/2 tablets daily except 2 tablets on Sundays Continue greens Recheck in 4 weeks.

## 2022-11-05 ENCOUNTER — Other Ambulatory Visit: Payer: Self-pay | Admitting: Cardiology

## 2022-11-05 DIAGNOSIS — Z7901 Long term (current) use of anticoagulants: Secondary | ICD-10-CM

## 2022-11-09 NOTE — Telephone Encounter (Signed)
Prescription refill request received for warfarin Lov: 06/28/22 ([facility])  Next INR check: 09/14/22 Warfarin tablet strength: OVERDUE   Pt past due for anticoagulation clinic appt.

## 2023-02-01 ENCOUNTER — Telehealth: Payer: Self-pay | Admitting: Cardiology

## 2023-02-01 DIAGNOSIS — Z7901 Long term (current) use of anticoagulants: Secondary | ICD-10-CM

## 2023-02-01 MED ORDER — WARFARIN SODIUM 5 MG PO TABS: ORAL_TABLET | ORAL | 5 refills | Status: DC

## 2023-02-01 NOTE — Telephone Encounter (Signed)
Pt c/o medication issue:  1. Name of Medication:  Warfarin   2. How are you currently taking this medication (dosage and times per day)?   3. Are you having a reaction (difficulty breathing--STAT)?   4. What is your medication issue?   Patient states she has been completely out of Warfarin for the past week. She would like to know if she should take Aspirin until 4/01 coumadin appointment.

## 2023-02-01 NOTE — Telephone Encounter (Signed)
Spoke with pt.  Her father has been in the hospital since November 2023.  She is completely out of warfarin but she has made an INR appt for 02/14/23.  Told pt I would send in refill so she can get back on warfarin before her INR check.  Rx sent to [facility].

## 2023-02-14 ENCOUNTER — Ambulatory Visit: Payer: 59 | Attending: Cardiology | Admitting: *Deleted

## 2023-02-14 DIAGNOSIS — D6851 Activated protein C resistance: Secondary | ICD-10-CM | POA: Diagnosis not present

## 2023-02-14 DIAGNOSIS — Z7901 Long term (current) use of anticoagulants: Secondary | ICD-10-CM

## 2023-02-14 LAB — POCT INR: INR: 2.7 (ref 2.0–3.0)

## 2023-02-14 NOTE — Patient Instructions (Signed)
Continue warfarin 1 1/2 tablets daily except 2 tablets on Sundays Continue greens Recheck in 6 weeks.

## 2023-03-28 ENCOUNTER — Ambulatory Visit: Payer: 59 | Attending: Cardiovascular Disease | Admitting: *Deleted

## 2023-03-28 DIAGNOSIS — D6851 Activated protein C resistance: Secondary | ICD-10-CM

## 2023-03-28 DIAGNOSIS — Z7901 Long term (current) use of anticoagulants: Secondary | ICD-10-CM

## 2023-03-28 LAB — POCT INR: INR: 3.1 — AB (ref 2.0–3.0)

## 2023-03-28 NOTE — Patient Instructions (Signed)
Take warfarin 1 tablet tonight then resume 1 1/2 tablets daily except 2 tablets on Sundays Continue greens Recheck in 6 weeks.

## 2023-05-09 ENCOUNTER — Ambulatory Visit: Payer: 59 | Attending: Cardiovascular Disease | Admitting: *Deleted

## 2023-05-09 DIAGNOSIS — Z7901 Long term (current) use of anticoagulants: Secondary | ICD-10-CM | POA: Diagnosis not present

## 2023-05-09 DIAGNOSIS — D6851 Activated protein C resistance: Secondary | ICD-10-CM | POA: Diagnosis not present

## 2023-05-09 LAB — POCT INR: INR: 2.1 (ref 2.0–3.0)

## 2023-05-09 NOTE — Patient Instructions (Signed)
Continue warfarin 1 1/2 tablets daily except 2 tablets on Sundays Continue greens Recheck in 6 weeks.  

## 2023-06-22 ENCOUNTER — Ambulatory Visit: Payer: 59 | Attending: Cardiovascular Disease | Admitting: *Deleted

## 2023-06-22 DIAGNOSIS — Z7901 Long term (current) use of anticoagulants: Secondary | ICD-10-CM | POA: Diagnosis not present

## 2023-06-22 DIAGNOSIS — D6851 Activated protein C resistance: Secondary | ICD-10-CM

## 2023-06-22 LAB — POCT INR: INR: 2.6 (ref 2.0–3.0)

## 2023-06-22 NOTE — Patient Instructions (Signed)
Continue warfarin 1 1/2 tablets daily except 2 tablets on Sundays Continue greens Recheck in 6 weeks.

## 2023-07-24 ENCOUNTER — Other Ambulatory Visit: Payer: Self-pay | Admitting: Cardiology

## 2023-07-24 DIAGNOSIS — Z7901 Long term (current) use of anticoagulants: Secondary | ICD-10-CM

## 2023-07-25 NOTE — Telephone Encounter (Signed)
Refill request for warfarin:  Last INR was 2.6 on 06/22/23 Next INR due 08/03/23 LOV was 06/28/22  Refill approved.

## 2023-08-03 ENCOUNTER — Ambulatory Visit: Payer: 59 | Attending: Cardiovascular Disease | Admitting: *Deleted

## 2023-08-03 DIAGNOSIS — Z7901 Long term (current) use of anticoagulants: Secondary | ICD-10-CM

## 2023-08-03 LAB — POCT INR: POC INR: 2.9

## 2023-08-03 NOTE — Patient Instructions (Signed)
Description   Continue warfarin 1 1/2 tablets daily except 2 tablets on Sundays Continue greens Recheck in 6 weeks.

## 2023-09-14 ENCOUNTER — Ambulatory Visit: Payer: 59

## 2024-04-20 ENCOUNTER — Other Ambulatory Visit: Payer: Self-pay | Admitting: Cardiology

## 2024-04-20 DIAGNOSIS — Z7901 Long term (current) use of anticoagulants: Secondary | ICD-10-CM

## 2024-07-04 ENCOUNTER — Ambulatory Visit: Attending: Cardiology | Admitting: *Deleted

## 2024-07-04 DIAGNOSIS — Z7901 Long term (current) use of anticoagulants: Secondary | ICD-10-CM

## 2024-07-04 DIAGNOSIS — D6851 Activated protein C resistance: Secondary | ICD-10-CM | POA: Diagnosis not present

## 2024-07-04 LAB — POCT INR: INR: 1.1 — AB (ref 2.0–3.0)

## 2024-07-04 MED ORDER — WARFARIN SODIUM 5 MG PO TABS: ORAL_TABLET | ORAL | 1 refills | Status: AC

## 2024-07-04 NOTE — Patient Instructions (Signed)
 Take warfarin 2 tablets tonight and tomorrow night then resume 1 1/2 tablets daily except 2 tablets on Sundays Continue greens Recheck in 3 weeks.

## 2024-07-04 NOTE — Progress Notes (Signed)
 INR 1.1 Please see anticoagulation encounter

## 2024-07-24 ENCOUNTER — Encounter

## 2024-07-30 ENCOUNTER — Ambulatory Visit: Attending: Cardiovascular Disease | Admitting: *Deleted

## 2024-07-30 DIAGNOSIS — Z7901 Long term (current) use of anticoagulants: Secondary | ICD-10-CM

## 2024-07-30 DIAGNOSIS — D6851 Activated protein C resistance: Secondary | ICD-10-CM | POA: Diagnosis not present

## 2024-07-30 LAB — POCT INR: INR: 3.1 — AB (ref 2.0–3.0)

## 2024-07-30 NOTE — Progress Notes (Signed)
 INR 3.1  Please see anticoagulation encounter

## 2024-07-30 NOTE — Patient Instructions (Signed)
 Continue warfarin 1 1/2 tablets daily except 2 tablets on Sundays  Continue greens Recheck in 3 weeks.

## 2024-08-20 ENCOUNTER — Encounter

## 2024-09-03 ENCOUNTER — Ambulatory Visit: Attending: Cardiovascular Disease | Admitting: *Deleted

## 2024-09-03 DIAGNOSIS — D6851 Activated protein C resistance: Secondary | ICD-10-CM

## 2024-09-03 DIAGNOSIS — Z7901 Long term (current) use of anticoagulants: Secondary | ICD-10-CM

## 2024-09-03 LAB — POCT INR: INR: 2.7 (ref 2.0–3.0)

## 2024-09-03 NOTE — Progress Notes (Signed)
 INR 2.7; Please see anticoagulation encounter

## 2024-09-03 NOTE — Patient Instructions (Signed)
 Continue warfarin 1 1/2 tablets daily except 2 tablets on Sundays  Continue greens Recheck in 4 weeks.

## 2024-10-01 ENCOUNTER — Ambulatory Visit: Attending: Cardiovascular Disease | Admitting: *Deleted

## 2024-10-01 DIAGNOSIS — Z7901 Long term (current) use of anticoagulants: Secondary | ICD-10-CM

## 2024-10-01 DIAGNOSIS — D6851 Activated protein C resistance: Secondary | ICD-10-CM

## 2024-10-01 LAB — POCT INR: INR: 2.7 (ref 2.0–3.0)

## 2024-10-01 NOTE — Progress Notes (Signed)
 INR 2.7; Please see anticoagulation encounter

## 2024-10-01 NOTE — Patient Instructions (Signed)
Continue warfarin 1 1/2 tablets daily except 2 tablets on Sundays Continue greens Recheck in 6 weeks.

## 2024-11-13 ENCOUNTER — Ambulatory Visit: Attending: Cardiovascular Disease | Admitting: *Deleted

## 2024-11-13 DIAGNOSIS — D6851 Activated protein C resistance: Secondary | ICD-10-CM | POA: Diagnosis not present

## 2024-11-13 DIAGNOSIS — Z7901 Long term (current) use of anticoagulants: Secondary | ICD-10-CM | POA: Diagnosis not present

## 2024-11-13 LAB — POCT INR: INR: 1.2 — AB (ref 2.0–3.0)

## 2024-11-13 NOTE — Progress Notes (Signed)
 INR 1.2; Please see anticoagulation encounter

## 2024-11-13 NOTE — Patient Instructions (Signed)
 Take warfarin 2 tablets tonight and tomorrow night then resume 1 1/2 tablets daily except 2 tablets on Sundays Continue greens Recheck in 3 weeks.

## 2024-12-04 ENCOUNTER — Ambulatory Visit

## 2024-12-18 ENCOUNTER — Ambulatory Visit
